# Patient Record
Sex: Male | Born: 1945 | Race: White | Hispanic: No | Marital: Married | Smoking: Former smoker
Health system: Southern US, Community
[De-identification: ages and names within clinical notes are randomized; demographics above are authoritative.]

## PROBLEM LIST (undated history)

## (undated) DIAGNOSIS — E119 Type 2 diabetes mellitus without complications: Secondary | ICD-10-CM

## (undated) DIAGNOSIS — E785 Hyperlipidemia, unspecified: Secondary | ICD-10-CM

## (undated) DIAGNOSIS — I10 Essential (primary) hypertension: Secondary | ICD-10-CM

## (undated) HISTORY — DX: Hyperlipidemia, unspecified: E78.5

## (undated) HISTORY — PX: TOTAL KNEE ARTHROPLASTY: SHX125

## (undated) HISTORY — PX: HERNIA REPAIR: SHX51

## (undated) HISTORY — DX: Type 2 diabetes mellitus without complications: E11.9

## (undated) HISTORY — DX: Essential (primary) hypertension: I10

---

## 2005-12-12 ENCOUNTER — Ambulatory Visit (HOSPITAL_COMMUNITY): Admission: RE | Admit: 2005-12-12 | Discharge: 2005-12-12 | Payer: Self-pay | Admitting: Family Medicine

## 2009-01-04 ENCOUNTER — Encounter (INDEPENDENT_AMBULATORY_CARE_PROVIDER_SITE_OTHER): Payer: Self-pay | Admitting: *Deleted

## 2009-10-20 ENCOUNTER — Telehealth: Payer: Self-pay | Admitting: Gastroenterology

## 2010-07-10 ENCOUNTER — Inpatient Hospital Stay (HOSPITAL_COMMUNITY): Admission: RE | Admit: 2010-07-10 | Discharge: 2010-07-12 | Payer: Self-pay | Admitting: Orthopedic Surgery

## 2010-08-02 ENCOUNTER — Encounter (HOSPITAL_COMMUNITY)
Admission: RE | Admit: 2010-08-02 | Discharge: 2010-09-01 | Payer: Self-pay | Source: Home / Self Care | Admitting: Orthopedic Surgery

## 2010-09-02 ENCOUNTER — Encounter (HOSPITAL_COMMUNITY)
Admission: RE | Admit: 2010-09-02 | Discharge: 2010-10-02 | Payer: Self-pay | Source: Home / Self Care | Attending: Orthopedic Surgery | Admitting: Orthopedic Surgery

## 2010-09-06 ENCOUNTER — Ambulatory Visit (HOSPITAL_COMMUNITY)
Admission: RE | Admit: 2010-09-06 | Discharge: 2010-09-06 | Payer: Self-pay | Source: Home / Self Care | Attending: General Surgery | Admitting: General Surgery

## 2010-10-31 NOTE — Progress Notes (Signed)
Summary: Schedule Colonoscopy  Phone Note Outgoing Call Call back at [facility] Phone [phone]   Call placed by: Harlow Mares CMA Duncan Dull),  October 20, 2009 3:45 PM Call placed to: Patient Summary of Call: number busy. Initial call taken by: Harlow Mares CMA Duncan Dull),  October 20, 2009 3:45 PM  Follow-up for Phone Call        number busy Follow-up by: Harlow Mares CMA Duncan Dull),  October 25, 2009 2:03 PM  Additional Follow-up for Phone Call Additional follow up Details #1::        patient goes to [place] [facility] in [place]. Lady Gary will note in [facility] patient change practices. Additional Follow-up by: Harlow Mares CMA (AAMA),  November 02, 2009 3:35 PM

## 2010-12-12 LAB — SURGICAL PCR SCREEN: MRSA, PCR: NEGATIVE

## 2010-12-12 LAB — CBC
HCT: 38.5 % — ABNORMAL LOW (ref 39.0–52.0)
Hemoglobin: 14.1 g/dL (ref 13.0–17.0)
MCV: 84.8 fL (ref 78.0–100.0)
RBC: 4.54 MIL/uL (ref 4.22–5.81)
WBC: 8.7 10*3/uL (ref 4.0–10.5)

## 2010-12-12 LAB — BASIC METABOLIC PANEL
BUN: 8 mg/dL (ref 6–23)
Chloride: 101 mEq/L (ref 96–112)
GFR calc Af Amer: >60 mL/min (ref >60)
Potassium: 2.8 mEq/L — ABNORMAL LOW (ref 3.5–5.1)
Sodium: 141 mEq/L (ref 135–145)

## 2010-12-12 LAB — POCT I-STAT 4, (NA,K, GLUC, HGB,HCT): Glucose, Bld: 126 mg/dL — ABNORMAL HIGH (ref 70–99)

## 2010-12-14 LAB — BASIC METABOLIC PANEL
BUN: 10 mg/dL (ref 6–23)
BUN: 9 mg/dL (ref 6–23)
CO2: 31 mEq/L (ref 19–32)
Calcium: 7.8 mg/dL — ABNORMAL LOW (ref 8.4–10.5)
Calcium: 7.9 mg/dL — ABNORMAL LOW (ref 8.4–10.5)
Chloride: 103 mEq/L (ref 96–112)
Chloride: 107 mEq/L (ref 96–112)
Creatinine, Ser: 1.1 mg/dL (ref 0.4–1.5)
Creatinine, Ser: 1.22 mg/dL (ref 0.4–1.5)
GFR calc Af Amer: >60 mL/min (ref >60)
Glucose, Bld: 136 mg/dL — ABNORMAL HIGH (ref 70–99)

## 2010-12-14 LAB — APTT: aPTT: 33 s (ref 24–37)

## 2010-12-14 LAB — CBC
HCT: 33.1 % — ABNORMAL LOW (ref 39.0–52.0)
HCT: 40.7 % (ref 39.0–52.0)
Hemoglobin: 11.7 g/dL — ABNORMAL LOW (ref 13.0–17.0)
Hemoglobin: 14.9 g/dL (ref 13.0–17.0)
MCH: 30.8 pg (ref 26.0–34.0)
MCH: 31.5 pg (ref 26.0–34.0)
MCH: 32 pg (ref 26.0–34.0)
MCHC: 35.3 g/dL (ref 30.0–36.0)
MCHC: 36.6 g/dL — ABNORMAL HIGH (ref 30.0–36.0)
MCV: 87.5 fL (ref 78.0–100.0)
MCV: 87.7 fL (ref 78.0–100.0)
MCV: 89 fL (ref 78.0–100.0)
Platelets: 172 10*3/uL (ref 150–400)
Platelets: 199 K/uL (ref 150–400)
RBC: 3.72 MIL/uL — ABNORMAL LOW (ref 4.22–5.81)
RBC: 4.65 MIL/uL (ref 4.22–5.81)
RDW: 12.8 % (ref 11.5–15.5)
RDW: 12.8 % (ref 11.5–15.5)
WBC: 10 K/uL (ref 4.0–10.5)
WBC: 8.4 10*3/uL (ref 4.0–10.5)

## 2010-12-14 LAB — DIFFERENTIAL
Basophils Absolute: 0 10*3/uL (ref 0.0–0.1)
Eosinophils Absolute: 0.1 10*3/uL (ref 0.0–0.7)
Eosinophils Relative: 1 % (ref 0–5)
Lymphocytes Relative: 29 % (ref 12–46)
Lymphs Abs: 2.9 10*3/uL (ref 0.7–4.0)
Neutrophils Relative %: 62 % (ref 43–77)

## 2010-12-14 LAB — CROSSMATCH
ABO/RH(D): O POS
Antibody Screen: NEGATIVE

## 2010-12-14 LAB — COMPREHENSIVE METABOLIC PANEL
ALT: 20 U/L (ref 0–53)
CO2: 34 mEq/L — ABNORMAL HIGH (ref 19–32)
Calcium: 9.5 mg/dL (ref 8.4–10.5)
Chloride: 100 mEq/L (ref 96–112)
Creatinine, Ser: 1.15 mg/dL (ref 0.4–1.5)
GFR calc non Af Amer: >60 mL/min (ref >60)
Glucose, Bld: 112 mg/dL — ABNORMAL HIGH (ref 70–99)
Total Bilirubin: 0.7 mg/dL (ref 0.3–1.2)

## 2010-12-14 LAB — URINALYSIS, ROUTINE W REFLEX MICROSCOPIC
Glucose, UA: 250 mg/dL — AB
Hgb urine dipstick: NEGATIVE
Ketones, ur: NEGATIVE mg/dL
Protein, ur: NEGATIVE mg/dL
Urobilinogen, UA: 1 mg/dL (ref 0.0–1.0)

## 2010-12-14 LAB — URINE CULTURE
Colony Count: NO GROWTH
Culture: NO GROWTH

## 2010-12-14 LAB — PROTIME-INR
INR: 1.09 (ref 0.00–1.49)
Prothrombin Time: 14.3 seconds (ref 11.6–15.2)

## 2010-12-14 LAB — SURGICAL PCR SCREEN
MRSA, PCR: POSITIVE — AB
Staphylococcus aureus: POSITIVE — AB

## 2011-05-11 IMAGING — CR DG CHEST 2V
2 series · 2 of 2 positions shown · non-contrast
Comparison: 12/12/2005

CLINICAL DATA: Preop.

CHEST - 2 VIEW

[view not recorded (1 of 2)]
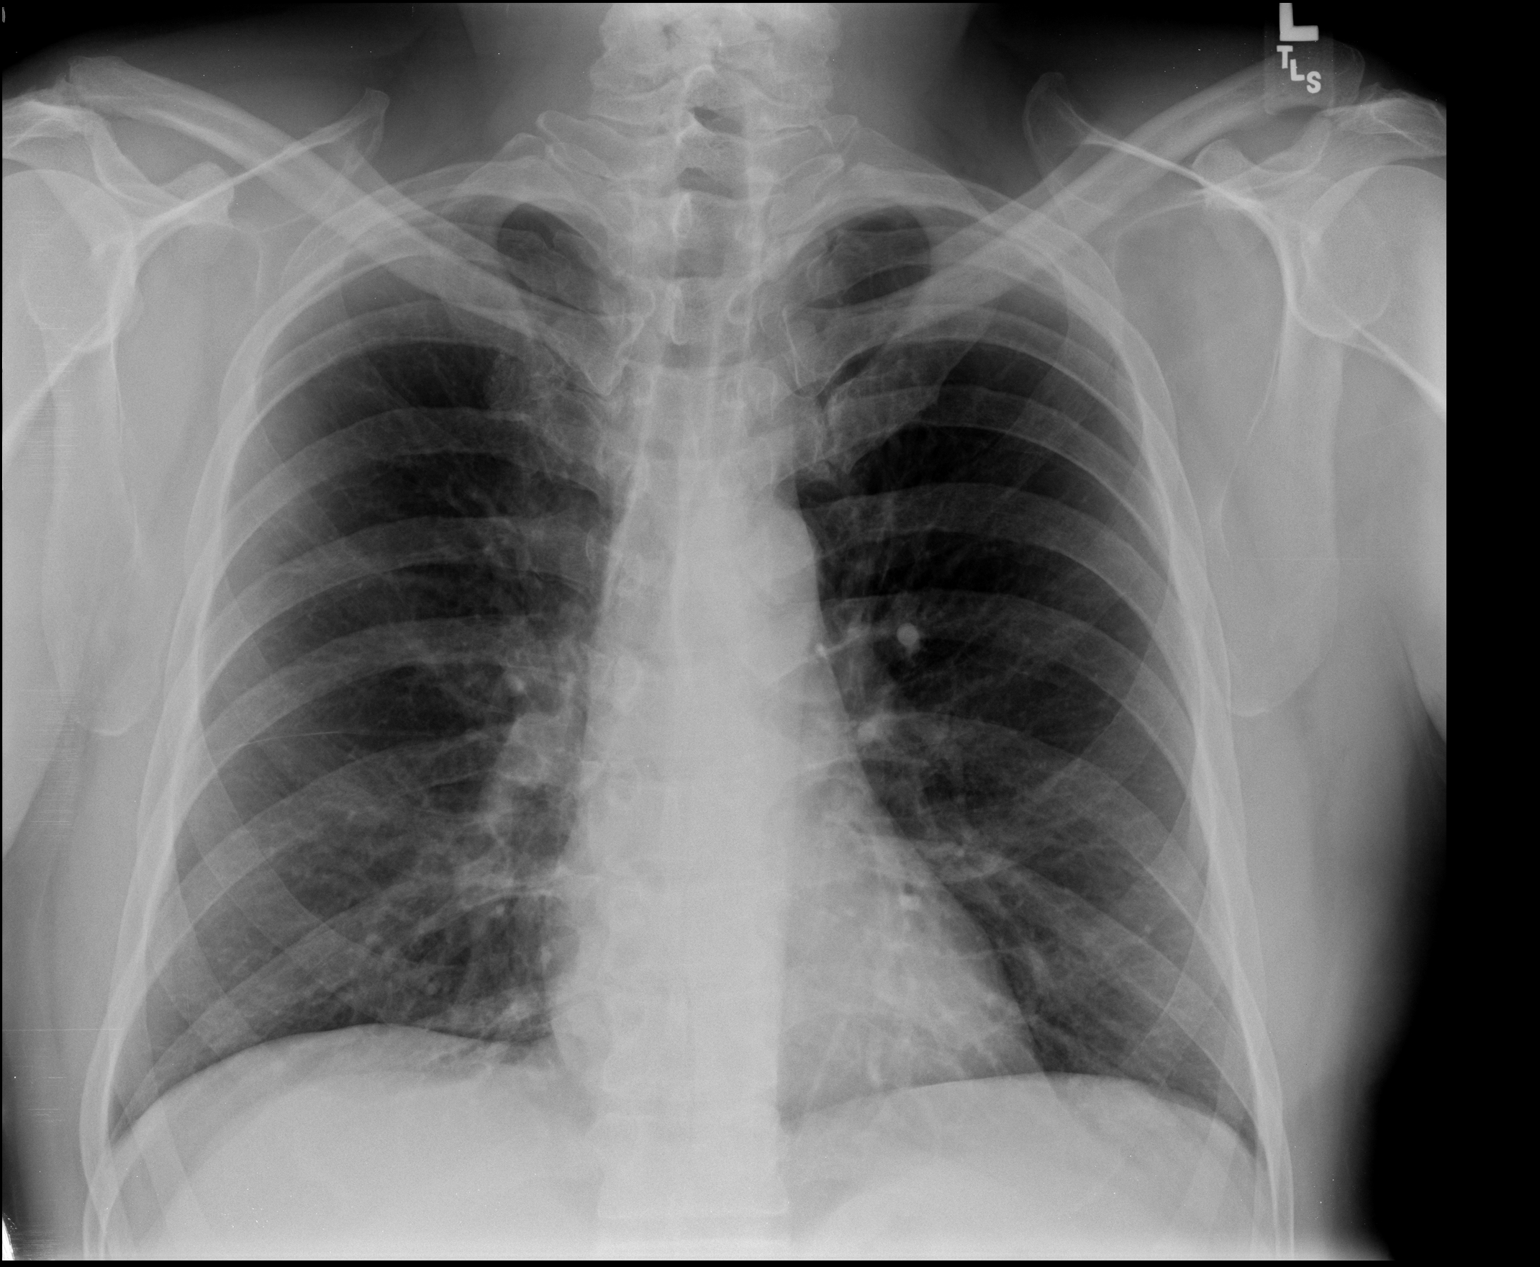

[view not recorded (2 of 2)]
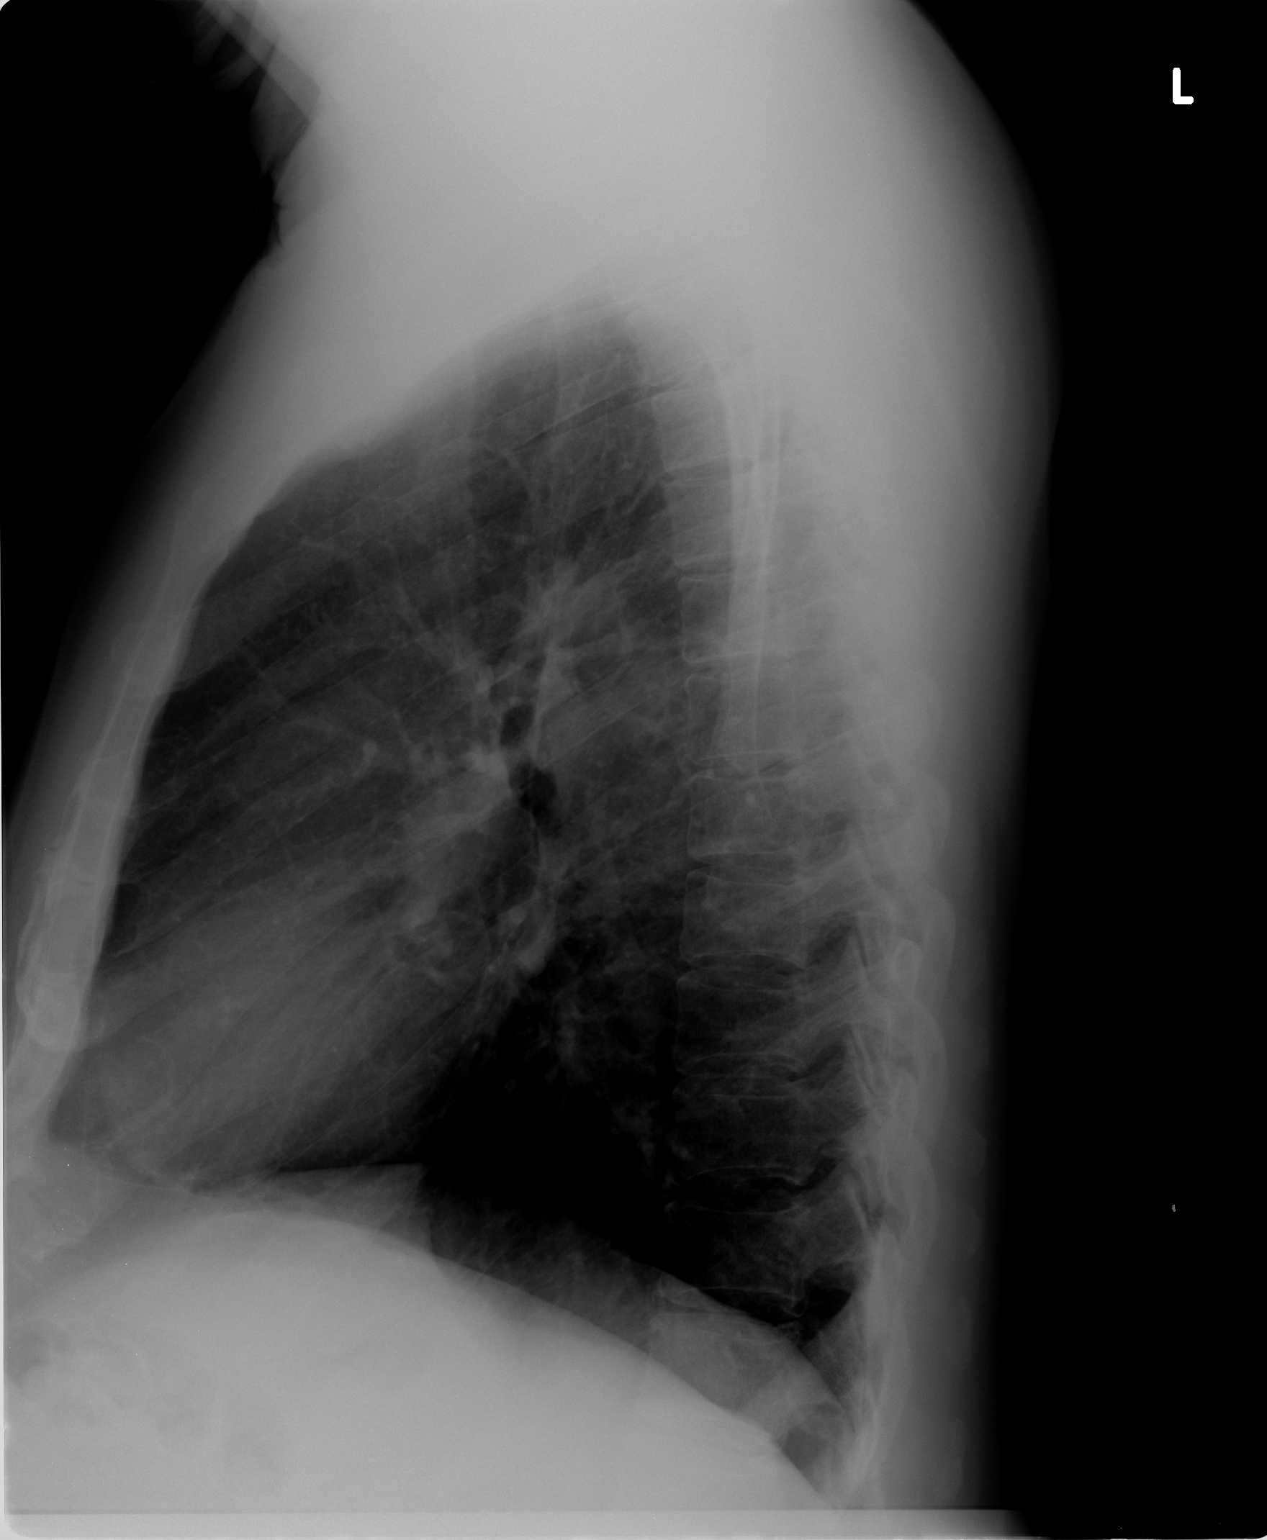

[2 of 2 positions shown; findings below may reference images not displayed]

FINDINGS: Trachea is midline.  Heart size normal.  Lungs are clear.
No pleural fluid.
IMPRESSION: No acute findings.

## 2013-09-07 SURGERY — Surgical Case
Anesthesia: *Unknown

## 2014-10-27 ENCOUNTER — Telehealth: Payer: Self-pay | Admitting: Gastroenterology

## 2014-10-27 ENCOUNTER — Encounter: Payer: Self-pay | Admitting: Gastroenterology

## 2014-12-01 ENCOUNTER — Ambulatory Visit (AMBULATORY_SURGERY_CENTER): Payer: Self-pay

## 2014-12-01 VITALS — Ht 71.0 in | Wt 199.0 lb

## 2014-12-01 DIAGNOSIS — Z860100 Personal history of colon polyps, unspecified: Secondary | ICD-10-CM

## 2014-12-01 MED ORDER — MOVIPREP 100 G PO SOLR: 1.0000 | Freq: Once | ORAL | Status: DC

## 2014-12-01 NOTE — Progress Notes (Signed)
No allergies to eggs or soy No home oxygen No past problems with anesthesia No diet/weight loss meds  Has email  Emmi  Instructions given for colonoscopy

## 2014-12-02 ENCOUNTER — Ambulatory Visit (INDEPENDENT_AMBULATORY_CARE_PROVIDER_SITE_OTHER): Payer: Medicare Other | Admitting: Otolaryngology

## 2014-12-02 DIAGNOSIS — J340 Abscess, furuncle and carbuncle of nose: Secondary | ICD-10-CM | POA: Diagnosis not present

## 2014-12-03 ENCOUNTER — Telehealth: Payer: Self-pay | Admitting: Gastroenterology

## 2014-12-03 NOTE — Telephone Encounter (Signed)
Patient states his prep was 102 dollars at the pharmacy. I asked patient if I can mail him a 10 dollar voucher to help with the cost of the prep. Pt states that would be great and he will wait for the coupon and then go pick up the prep.

## 2014-12-09 ENCOUNTER — Ambulatory Visit (INDEPENDENT_AMBULATORY_CARE_PROVIDER_SITE_OTHER): Payer: Medicare Other | Admitting: Otolaryngology

## 2014-12-09 DIAGNOSIS — J340 Abscess, furuncle and carbuncle of nose: Secondary | ICD-10-CM

## 2014-12-15 ENCOUNTER — Ambulatory Visit (AMBULATORY_SURGERY_CENTER): Payer: Medicare Other | Admitting: Gastroenterology

## 2014-12-15 ENCOUNTER — Encounter: Payer: Self-pay | Admitting: Gastroenterology

## 2014-12-15 VITALS — BP 126/84 | HR 58 | Temp 97.3°F | Resp 20 | Ht 71.0 in | Wt 199.0 lb

## 2014-12-15 DIAGNOSIS — Z860101 Personal history of adenomatous and serrated colon polyps: Secondary | ICD-10-CM

## 2014-12-15 DIAGNOSIS — Z86010 Personal history of colonic polyps: Secondary | ICD-10-CM

## 2014-12-15 MED ORDER — SODIUM CHLORIDE 0.9 % IV SOLN: 500.0000 mL | INTRAVENOUS | Status: DC

## 2014-12-15 NOTE — Op Note (Signed)
[place]  [address] [place], [phone]   COLONOSCOPY PROCEDURE REPORT  PATIENT: [name], Nathaniel  MR#: [id number] BIRTHDATE: [birth year] , 6  yrs. old GENDER: male ENDOSCOPIST: Ladene Artist, MD, [facility] REFERRED RD:EYCX Hilma Favors, M.D. PROCEDURE DATE:  12/15/2014 PROCEDURE:   Colonoscopy, surveillance First Screening Colonoscopy - Avg.  risk and is 50 yrs.  old or older - No.  Prior Negative Screening - Now for repeat screening. N/A  History of Adenoma - Now for follow-up colonoscopy & has been > or = to 3 yrs.  Yes hx of adenoma.  Has been 3 or more years since last colonoscopy. ASA CLASS:   Class II INDICATIONS:Surveillance due to prior colonic neoplasia and PH Colon Adenoma. MEDICATIONS: Monitored anesthesia care and Propofol 200 mg IV DESCRIPTION OF PROCEDURE:   After the risks benefits and alternatives of the procedure were thoroughly explained, informed consent was obtained.  The digital rectal exam revealed no abnormalities of the rectum.   The LB [id number]  endoscope was introduced through the anus and advanced to the cecum, which was identified by both the appendix and ileocecal valve. No adverse events experienced.   The quality of the prep was good.  (MoviPrep was used)  The instrument was then slowly withdrawn as the colon was fully examined.    COLON FINDINGS: A normal appearing cecum, ileocecal valve, and appendiceal orifice were identified.  The ascending, transverse, descending, sigmoid colon, and rectum appeared unremarkable. Retroflexed views revealed internal Grade I hemorrhoids. The time to cecum = 2.2 Withdrawal time = 11.2   The scope was withdrawn and the procedure completed. COMPLICATIONS: There were no immediate complications.  ENDOSCOPIC IMPRESSION: 1.  Normal colonoscopy 2.  Grade I internal hemorrhoids  RECOMMENDATIONS: Repeat Colonoscopy in 5 years.  eSigned:  Ladene Artist, MD, [facility] 12/15/2014 9:07  AM

## 2014-12-15 NOTE — Progress Notes (Signed)
Report to PACU, RN, vss, BBS= Clear.  

## 2014-12-15 NOTE — Patient Instructions (Signed)
Normal colon exam today, hemorrhoids seen today. Handout on hemorrhoids given. Repeat colonoscopy in 5 years. Call us with any questions or concerns. Thank you!  YOU HAD AN ENDOSCOPIC PROCEDURE TODAY AT [facility]:   Refer to the procedure report that was given to you for any specific questions about what was found during the examination.  If the procedure report does not answer your questions, please call your gastroenterologist to clarify.  If you requested that your care partner not be given the details of your procedure findings, then the procedure report has been included in a sealed envelope for you to review at your convenience later.  YOU SHOULD EXPECT: Some feelings of bloating in the abdomen. Passage of more gas than usual.  Walking can help get rid of the air that was put into your GI tract during the procedure and reduce the bloating. If you had a lower endoscopy (such as a colonoscopy or flexible sigmoidoscopy) you may notice spotting of blood in your stool or on the toilet paper. If you underwent a bowel prep for your procedure, you may not have a normal bowel movement for a few days.  Please Note:  You might notice some irritation and congestion in your nose or some drainage.  This is from the oxygen used during your procedure.  There is no need for concern and it should clear up in a day or so.  SYMPTOMS TO REPORT IMMEDIATELY:   Following lower endoscopy (colonoscopy or flexible sigmoidoscopy):  Excessive amounts of blood in the stool  Significant tenderness or worsening of abdominal pains  Swelling of the abdomen that is new, acute  Fever of 100F or higher   Following upper endoscopy (EGD)  Vomiting of blood or coffee ground material  New chest pain or pain under the shoulder blades  Painful or persistently difficult swallowing  New shortness of breath  Fever of 100F or higher  Black, tarry-looking stools  For urgent or emergent issues, a  gastroenterologist can be reached at any hour by calling [phone].   DIET: Your first meal following the procedure should be a small meal and then it is ok to progress to your normal diet. Heavy or fried foods are harder to digest and may make you feel nauseous or bloated.  Likewise, meals heavy in dairy and vegetables can increase bloating.  Drink plenty of fluids but you should avoid alcoholic beverages for 24 hours.  ACTIVITY:  You should plan to take it easy for the rest of today and you should NOT DRIVE or use heavy machinery until tomorrow (because of the sedation medicines used during the test).    FOLLOW UP: Our staff will call the number listed on your records the next business day following your procedure to check on you and address any questions or concerns that you may have regarding the information given to you following your procedure. If we do not reach you, we will leave a message.  However, if you are feeling well and you are not experiencing any problems, there is no need to return our call.  We will assume that you have returned to your regular daily activities without incident.  If any biopsies were taken you will be contacted by phone or by letter within the next 1-3 weeks.  Please call us at [phone] if you have not heard about the biopsies in 3 weeks.    SIGNATURES/CONFIDENTIALITY: You and/or your care partner have signed paperwork which will be  entered into your electronic medical record.  These signatures attest to the fact that that the information above on your After Visit Summary has been reviewed and is understood.  Full responsibility of the confidentiality of this discharge information lies with you and/or your care-partner. 

## 2014-12-16 ENCOUNTER — Telehealth: Payer: Self-pay

## 2014-12-16 NOTE — Telephone Encounter (Signed)
  Follow up Call-  Call back number 12/15/2014  Post procedure Call Back phone  # [phone]  Permission to leave phone message Yes     Patient questions:  Do you have a fever, pain , or abdominal swelling? No. Pain Score  0 *  Have you tolerated food without any problems? Yes.    Have you been able to return to your normal activities? Yes.    Do you have any questions about your discharge instructions: Diet   No. Medications  No. Follow up visit  No.  Do you have questions or concerns about your Care? No.  Actions: * If pain score is 4 or above: No action needed, pain <4.

## 2014-12-17 NOTE — Telephone Encounter (Signed)
appt done

## 2015-12-21 ENCOUNTER — Ambulatory Visit (INDEPENDENT_AMBULATORY_CARE_PROVIDER_SITE_OTHER): Payer: PPO | Admitting: Urology

## 2015-12-21 DIAGNOSIS — N401 Enlarged prostate with lower urinary tract symptoms: Secondary | ICD-10-CM

## 2015-12-21 DIAGNOSIS — R351 Nocturia: Secondary | ICD-10-CM

## 2015-12-21 DIAGNOSIS — N476 Balanoposthitis: Secondary | ICD-10-CM

## 2015-12-21 DIAGNOSIS — R350 Frequency of micturition: Secondary | ICD-10-CM | POA: Diagnosis not present

## 2015-12-23 ENCOUNTER — Other Ambulatory Visit: Payer: Self-pay

## 2015-12-23 DIAGNOSIS — N471 Phimosis: Secondary | ICD-10-CM

## 2015-12-30 ENCOUNTER — Telehealth: Payer: Self-pay | Admitting: Urology

## 2015-12-30 NOTE — Patient Instructions (Addendum)
Nathaniel [name]  12/30/2015     @PREFPERIOPPHARMACY @   Your procedure is scheduled on  01/06/2016   Report to [name] at  11:30  A.M.  Call this number if you have problems the morning of surgery:  [phone]   Remember:  Do not eat food or drink liquids after midnight.  Take these medicines the morning of surgery with A SIP OF WATER  Lisinopril, flomax.   Do not wear jewelry, make-up or nail polish.  Do not wear lotions, powders, or perfumes.  You may wear deodorant.  Do not shave 48 hours prior to surgery.  Men may shave face and neck.  Do not bring valuables to the [name].  [name] is not responsible for any belongings or valuables.  Contacts, dentures or bridgework may not be worn into surgery.  Leave your suitcase in the car.  After surgery it may be brought to your room.  For patients admitted to the [name], discharge time will be determined by your treatment team.  Patients discharged the day of surgery will not be allowed to drive home.   Name and phone number of your driver:   family Special instructions:  none  Please read over the following fact sheets that you were given. Coughing and Deep Breathing, Surgical Site Infection Prevention, Anesthesia Post-op Instructions and Care and Recovery After Surgery      Circumcision, Adult Circumcision is a surgery to remove the foreskin of the penis or to cut the foreskin so the opening is larger. When only the foreskin is cut, it is called a dorsal (on top of the foreskin) incision. This procedure leaves the entire foreskin but makes the end of the foreskin looser so it can be pulled back over the head of the penis.  LET [facility] CARE PROVIDER KNOW ABOUT:  Any allergies you have.  All medicines you are taking, including vitamins, herbs, eye drops, creams, and over the counter medicines.  Previous problems you or members of your family have had with the use of anesthetics.  Any blood  disorders you have.  Previous surgeries you have had.  Medical conditions you have. RISKS AND COMPLICATIONS  Generally, circumcision is a safe procedure. However, as with any procedure, problems can occur. Possible problems include:  Bleeding.  Infection.  Pain.  Urethral injury. The urethra is the opening on the end of the penis that carries your urine out.  Breaking open of the surgical wound can occur from an unwanted erection after surgery. BEFORE THE PROCEDURE   Do not take aspirin or blood thinners for a week prior to surgery, or as your health care provider suggests.  Do not eat or drink anything after midnight the night before surgery, or as instructed.  Let your health care provider know if you develop a cold or other infection before surgery.  You should be present 1 hour before the procedure, or as directed.  Before the procedure, you will be washed and given a local anesthetic to make sure you feel no pain. PROCEDURE  An anesthetic will be given. When an anesthetic that just numbs the area of the procedure (local anesthetic) is used, the anesthetic will be injected with a needle into the skin of your penis to numb the nerves of your foreskin. Your surgeon will remove the excess foreskin with a surgical knife. Absorbable sutures will be used to close the incision after the foreskin has been removed. The  sutures will not need to be removed.  AFTER THE PROCEDURE A sterile dressing will be applied to the incision site. It may be difficult to keep the dressing in place. It is alright if the dressing comes off, especially at night. Air will help a scab to form which will eliminate the need for dressings during the day.   This information is not intended to replace advice given to you by your health care provider. Make sure you discuss any questions you have with your health care provider.   Document Released: 10/07/2007 Document Revised: 10/08/2014 Document Reviewed:  02/17/2013 Elsevier Interactive Patient Education 2016 [employer]. Circumcision, Adult, Care After These instructions give you information on caring for yourself after your procedure. Your doctor may also give you more specific instructions. Call your doctor if you have any problems or questions after your procedure. HOME CARE  Only take medicine as told by your doctor.  Any bandages (dressings) should stay on for at least 24 hours.  You may take the bandages off at night to let air get to the area where your doctor made a cut (incision site). Once a scab forms over the cut, you will not need to use bandages.  Carefully remove the bandage if it gets dirty. Apply medicated cream to the cut. Carefully put a new bandage on if a scab has not formed.  Do not have sex until your doctor says it is okay.  Do not get your cut wet for 24 hours or as told by your doctor.  You may take a sponge bath. Clean around the cut gently with mild soap and water.  You may take a shower after 24 hours or as told by your doctor. Do not take a tub bath. After you shower, gently pat your cut dry. Do not rub it.  Avoid heavy lifting.  Avoid contact sports, biking, or swimming until you have healed. This usually takes 10-14 days. GET HELP IF:  You have pain that does not go away after you take medicine for it.  You have puffiness (swelling) or redness that is unexpected.  You have a fever. GET HELP RIGHT AWAY IF:   You cannot pee (urinate).  You have pain when you pee.  Your pain is not helped by medicines.  There is redness, puffiness, and soreness spreading up the shaft of your penis, your thighs, or your lower belly (abdomen).  There is yellowish-white fluid (pus) coming from your cut.  You have bleeding that does not stop when you press on it. MAKE SURE YOU:  Understand these instructions.  Will watch your condition.  Will get help right away if you are not doing well or get worse.     This information is not intended to replace advice given to you by your health care provider. Make sure you discuss any questions you have with your health care provider.   Document Released: 03/05/2008 Document Revised: 10/08/2014 Document Reviewed: 05/14/2013 Elsevier Interactive Patient Education 2016 [employer] PATIENT INSTRUCTIONS POST-ANESTHESIA  IMMEDIATELY FOLLOWING SURGERY:  Do not drive or operate machinery for the first twenty four hours after surgery.  Do not make any important decisions for twenty four hours after surgery or while taking narcotic pain medications or sedatives.  If you develop intractable nausea and vomiting or a severe headache please notify your doctor immediately.  FOLLOW-UP:  Please make an appointment with your surgeon as instructed. You do not need to follow up with anesthesia unless specifically instructed to do  so.  WOUND CARE INSTRUCTIONS (if applicable):  Keep a dry clean dressing on the anesthesia/puncture wound site if there is drainage.  Once the wound has quit draining you may leave it open to air.  Generally you should leave the bandage intact for twenty four hours unless there is drainage.  If the epidural site drains for more than 36-48 hours please call the anesthesia department.  QUESTIONS?:  Please feel free to call your physician or the [name] operator if you have any questions, and they will be happy to assist you.

## 2015-12-30 NOTE — Telephone Encounter (Signed)
Pt advised of pre op date/time and sx date. Sx: 01/06/16 @ 1:00pm with dr McKenzie--Circumcision. Nathaniel [name]) Pre op: 01/02/16 @ 11:00am. Post op: 01/11/16 @ 10:30am. No authorization is required for CPT: 54161 per AVR with [employer] insurance @ [phone]

## 2016-01-02 ENCOUNTER — Other Ambulatory Visit: Payer: Self-pay

## 2016-01-02 ENCOUNTER — Encounter (HOSPITAL_COMMUNITY): Payer: Self-pay

## 2016-01-02 ENCOUNTER — Encounter (HOSPITAL_COMMUNITY)
Admission: RE | Admit: 2016-01-02 | Discharge: 2016-01-02 | Disposition: A | Discharge status: Home / Self Care | Payer: PPO | Source: Ambulatory Visit | Attending: Urology | Admitting: Urology

## 2016-01-02 DIAGNOSIS — Z01810 Encounter for preprocedural cardiovascular examination: Secondary | ICD-10-CM | POA: Insufficient documentation

## 2016-01-02 DIAGNOSIS — Z01812 Encounter for preprocedural laboratory examination: Secondary | ICD-10-CM | POA: Insufficient documentation

## 2016-01-02 LAB — CBC WITH DIFFERENTIAL/PLATELET
BASOS ABS: 0 10*3/uL (ref 0.0–0.1)
Basophils Relative: 1 %
Eosinophils Absolute: 0.1 10*3/uL (ref 0.0–0.7)
Eosinophils Relative: 2 %
HEMATOCRIT: 40.5 % (ref 39.0–52.0)
HEMOGLOBIN: 14.2 g/dL (ref 13.0–17.0)
LYMPHS ABS: 2.5 10*3/uL (ref 0.7–4.0)
LYMPHS PCT: 35 %
MCH: 31.2 pg (ref 26.0–34.0)
MCHC: 35.1 g/dL (ref 30.0–36.0)
MCV: 89 fL (ref 78.0–100.0)
Monocytes Absolute: 0.5 10*3/uL (ref 0.1–1.0)
Monocytes Relative: 7 %
NEUTROS ABS: 4.1 10*3/uL (ref 1.7–7.7)
NEUTROS PCT: 55 %
PLATELETS: 218 10*3/uL (ref 150–400)
RBC: 4.55 MIL/uL (ref 4.22–5.81)
RDW: 12.8 % (ref 11.5–15.5)
WBC: 7.4 10*3/uL (ref 4.0–10.5)

## 2016-01-02 LAB — BASIC METABOLIC PANEL
ANION GAP: 8 (ref 5–15)
BUN: 11 mg/dL (ref 6–20)
CHLORIDE: 105 mmol/L (ref 101–111)
CO2: 27 mmol/L (ref 22–32)
Calcium: 8.5 mg/dL — ABNORMAL LOW (ref 8.9–10.3)
Creatinine, Ser: 0.95 mg/dL (ref 0.61–1.24)
GFR calc Af Amer: >60 mL/min (ref >60)
GLUCOSE: 209 mg/dL — AB (ref 65–99)
POTASSIUM: 3.7 mmol/L (ref 3.5–5.1)
Sodium: 140 mmol/L (ref 135–145)

## 2016-01-02 LAB — SURGICAL PCR SCREEN
MRSA, PCR: NEGATIVE
STAPHYLOCOCCUS AUREUS: NEGATIVE

## 2016-01-06 ENCOUNTER — Encounter (HOSPITAL_COMMUNITY): Payer: Self-pay | Admitting: *Deleted

## 2016-01-06 ENCOUNTER — Ambulatory Visit (HOSPITAL_COMMUNITY)
Admission: RE | Admit: 2016-01-06 | Discharge: 2016-01-06 | Disposition: A | Discharge status: Home / Self Care | Payer: PPO | Source: Ambulatory Visit | Attending: Urology | Admitting: Urology

## 2016-01-06 ENCOUNTER — Encounter (HOSPITAL_COMMUNITY)
Admission: RE | Disposition: A | Discharge status: Home / Self Care | Payer: Self-pay | Source: Ambulatory Visit | Attending: Urology

## 2016-01-06 ENCOUNTER — Ambulatory Visit (HOSPITAL_COMMUNITY): Payer: PPO | Admitting: Anesthesiology

## 2016-01-06 DIAGNOSIS — I10 Essential (primary) hypertension: Secondary | ICD-10-CM | POA: Diagnosis not present

## 2016-01-06 DIAGNOSIS — Z87891 Personal history of nicotine dependence: Secondary | ICD-10-CM | POA: Diagnosis not present

## 2016-01-06 DIAGNOSIS — N471 Phimosis: Secondary | ICD-10-CM | POA: Diagnosis not present

## 2016-01-06 DIAGNOSIS — N480 Leukoplakia of penis: Secondary | ICD-10-CM | POA: Insufficient documentation

## 2016-01-06 DIAGNOSIS — Z7982 Long term (current) use of aspirin: Secondary | ICD-10-CM | POA: Insufficient documentation

## 2016-01-06 DIAGNOSIS — Z96652 Presence of left artificial knee joint: Secondary | ICD-10-CM | POA: Diagnosis not present

## 2016-01-06 HISTORY — PX: CIRCUMCISION: SHX1350

## 2016-01-06 LAB — GLUCOSE, CAPILLARY: Glucose-Capillary: 100 mg/dL — ABNORMAL HIGH (ref 65–99)

## 2016-01-06 SURGERY — CIRCUMCISION, ADULT
Anesthesia: General | Site: Penis

## 2016-01-06 MED ORDER — MIDAZOLAM HCL 5 MG/5ML IJ SOLN
INTRAMUSCULAR | Status: DC | PRN
  Administered 2016-01-06: 2 mg via INTRAVENOUS

## 2016-01-06 MED ORDER — PROPOFOL 10 MG/ML IV BOLUS
INTRAVENOUS | Status: DC | PRN
  Administered 2016-01-06: 140 mg via INTRAVENOUS

## 2016-01-06 MED ORDER — CEFAZOLIN SODIUM 1-5 GM-% IV SOLN
1.0000 g | INTRAVENOUS | Status: AC
  Administered 2016-01-06: 1 g via INTRAVENOUS

## 2016-01-06 MED ORDER — FENTANYL CITRATE (PF) 100 MCG/2ML IJ SOLN
INTRAMUSCULAR | Status: AC
  Filled 2016-01-06: qty 2

## 2016-01-06 MED ORDER — SUCCINYLCHOLINE CHLORIDE 20 MG/ML IJ SOLN
INTRAMUSCULAR | Status: AC
  Filled 2016-01-06: qty 1

## 2016-01-06 MED ORDER — LACTATED RINGERS IV SOLN
INTRAVENOUS | Status: DC
  Administered 2016-01-06: 1000 mL via INTRAVENOUS

## 2016-01-06 MED ORDER — CEFAZOLIN SODIUM 1-5 GM-% IV SOLN
INTRAVENOUS | Status: AC
  Filled 2016-01-06: qty 50

## 2016-01-06 MED ORDER — BUPIVACAINE HCL (PF) 0.25 % IJ SOLN
INTRAMUSCULAR | Status: AC
  Filled 2016-01-06: qty 30

## 2016-01-06 MED ORDER — LIDOCAINE HCL 1 % IJ SOLN
INTRAMUSCULAR | Status: DC | PRN
  Administered 2016-01-06: 25 mg via INTRADERMAL

## 2016-01-06 MED ORDER — SULFAMETHOXAZOLE-TRIMETHOPRIM 800-160 MG PO TABS: 1.0000 | ORAL_TABLET | Freq: Two times a day (BID) | ORAL | Status: AC

## 2016-01-06 MED ORDER — 0.9 % SODIUM CHLORIDE (POUR BTL) OPTIME
TOPICAL | Status: DC | PRN
  Administered 2016-01-06: 1000 mL

## 2016-01-06 MED ORDER — MIDAZOLAM HCL 2 MG/2ML IJ SOLN
INTRAMUSCULAR | Status: AC
  Filled 2016-01-06: qty 2

## 2016-01-06 MED ORDER — OXYCODONE-ACETAMINOPHEN 5-325 MG PO TABS: 1.0000 | ORAL_TABLET | ORAL | Status: AC | PRN

## 2016-01-06 MED ORDER — ONDANSETRON HCL 4 MG/2ML IJ SOLN
4.0000 mg | Freq: Once | INTRAMUSCULAR | Status: AC
  Administered 2016-01-06: 4 mg via INTRAVENOUS

## 2016-01-06 MED ORDER — FENTANYL CITRATE (PF) 100 MCG/2ML IJ SOLN
INTRAMUSCULAR | Status: DC | PRN
  Administered 2016-01-06: 50 ug via INTRAVENOUS
  Administered 2016-01-06 (×2): 25 ug via INTRAVENOUS

## 2016-01-06 MED ORDER — LIDOCAINE HCL 1 % IJ SOLN
INTRAMUSCULAR | Status: DC | PRN
  Administered 2016-01-06: 10 mL

## 2016-01-06 MED ORDER — ONDANSETRON HCL 4 MG/2ML IJ SOLN
INTRAMUSCULAR | Status: AC
  Filled 2016-01-06: qty 2

## 2016-01-06 MED ORDER — LIDOCAINE HCL (PF) 1 % IJ SOLN
INTRAMUSCULAR | Status: AC
  Filled 2016-01-06: qty 30

## 2016-01-06 MED ORDER — MIDAZOLAM HCL 2 MG/2ML IJ SOLN
1.0000 mg | INTRAMUSCULAR | Status: DC | PRN
  Administered 2016-01-06: 2 mg via INTRAVENOUS

## 2016-01-06 MED ORDER — FENTANYL CITRATE (PF) 100 MCG/2ML IJ SOLN
25.0000 ug | INTRAMUSCULAR | Status: AC
  Administered 2016-01-06 (×2): 25 ug via INTRAVENOUS

## 2016-01-06 MED ORDER — DIAZEPAM 5 MG PO TABS: 5.0000 mg | ORAL_TABLET | Freq: Every day | ORAL | Status: AC

## 2016-01-06 MED ORDER — SEVOFLURANE IN SOLN
RESPIRATORY_TRACT | Status: AC
  Filled 2016-01-06: qty 250

## 2016-01-06 SURGICAL SUPPLY — 28 items
BAG HAMPER (MISCELLANEOUS) ×3 IMPLANT
BANDAGE COBAN STERILE 2 (GAUZE/BANDAGES/DRESSINGS) ×3 IMPLANT
BNDG CONFORM 2 STRL LF (GAUZE/BANDAGES/DRESSINGS) ×3 IMPLANT
COVER LIGHT HANDLE STERIS (MISCELLANEOUS) ×4 IMPLANT
DECANTER SPIKE VIAL GLASS SM (MISCELLANEOUS) ×4 IMPLANT
DRSG XEROFORM 1X8 (GAUZE/BANDAGES/DRESSINGS) ×3 IMPLANT
ELECT NDL TIP 2.8 STRL (NEEDLE) ×1 IMPLANT
ELECT NEEDLE TIP 2.8 STRL (NEEDLE) ×3 IMPLANT
ELECT REM PT RETURN 9FT ADLT (ELECTROSURGICAL) ×3
ELECTRODE REM PT RTRN 9FT ADLT (ELECTROSURGICAL) ×1 IMPLANT
GAUZE SPONGE 4X4 12PLY STRL (GAUZE/BANDAGES/DRESSINGS) ×2 IMPLANT
GAUZE SPONGE 4X4 16PLY XRAY LF (GAUZE/BANDAGES/DRESSINGS) ×3 IMPLANT
GLOVE BIO SURGEON STRL SZ8 (GLOVE) ×3 IMPLANT
GOWN STRL REUS W/ TWL XL LVL3 (GOWN DISPOSABLE) ×1 IMPLANT
GOWN STRL REUS W/TWL XL LVL3 (GOWN DISPOSABLE) ×6
KIT ROOM TURNOVER APOR (KITS) ×6 IMPLANT
NDL HYPO 25X1 1.5 SAFETY (NEEDLE) IMPLANT
NEEDLE HYPO 25X1 1.5 SAFETY (NEEDLE) ×3 IMPLANT
NS IRRIG 500ML POUR BTL (IV SOLUTION) ×2 IMPLANT
PACK MINOR (CUSTOM PROCEDURE TRAY) ×3 IMPLANT
PAD ARMBOARD 7.5X6 YLW CONV (MISCELLANEOUS) ×3 IMPLANT
SET BASIN LINEN APH (SET/KITS/TRAYS/PACK) ×3 IMPLANT
SPONGE GAUZE 4X4 12PLY (GAUZE/BANDAGES/DRESSINGS) ×2 IMPLANT
SUT VIC AB 4-0 PS2 27 (SUTURE) ×6 IMPLANT
SUT VICRYL 4-0 PS2 18IN ABS (SUTURE) ×6 IMPLANT
SYR CONTROL 10ML LL (SYRINGE) ×2 IMPLANT
TOWEL OR 17X26 10 PK STRL BLUE (TOWEL DISPOSABLE) ×1 IMPLANT
WATER STERILE IRR 500ML POUR (IV SOLUTION) IMPLANT

## 2016-01-06 NOTE — Anesthesia Procedure Notes (Signed)
Procedure Name: LMA Insertion Date/Time: 01/06/2016 1:49 PM Performed by: Charmaine Downs Pre-anesthesia Checklist: Patient identified, Emergency Drugs available, Suction available and Patient being monitored Patient Re-evaluated:Patient Re-evaluated prior to inductionOxygen Delivery Method: Circle system utilized Preoxygenation: Pre-oxygenation with 100% oxygen Intubation Type: IV induction Ventilation: Mask ventilation without difficulty LMA: LMA inserted LMA Size: 4.0 Grade View: Grade I Number of attempts: 1 Placement Confirmation: positive ETCO2 and breath sounds checked- equal and bilateral Tube secured with: Tape Dental Injury: Teeth and Oropharynx as per pre-operative assessment

## 2016-01-06 NOTE — Discharge Instructions (Signed)
Circumcision, Adult, Care After Refer to this sheet in the next few weeks. These instructions provide you with information on caring for yourself after your procedure. Your health care provider may also give you more specific instructions. Your treatment has been planned according to current medical practices, but problems sometimes occur. Call your health care provider if you have any problems or questions after your procedure. WHAT TO EXPECT AFTER THE PROCEDURE After your procedure, it is typical to have the following sensations:  Redness at the incision site.  Soreness at the incision site.  Slight swelling around the incision. HOME CARE INSTRUCTIONS  Take all medicines as directed by your health care provider.  Any dressings should stay on for at least 24 hours. You may take the dressing off at night to let air circulate around the incision. Once a scab has formed over the incision, you no longer need to cover your incision with a dressing.  Carefully remove the bandage if it gets dirty. Apply medicated cream to the incision. Carefully put a new bandage on if a scab has not formed.  Do not have sex until your health care provider says it is okay.  Do not get your incision site wet for 24 hours or as told by your health care provider.  You may take a sponge bath. Clean around the incision site gently with mild soap and water.  You may take a shower after 24 hours or as told by your health care provider. Do not take a tub bath. After you shower, gently pat dry the incision site. Do not rub it.  Avoid heavy lifting.  Avoid contact sports, biking, or swimming until you have healed. This will usually be between 10 days and 14 days after the procedure. SEEK MEDICAL CARE IF:  You are experiencing pain that is not relieved by your medicine.  You have swelling or redness that is unexpected.  You develop a fever. SEEK IMMEDIATE MEDICAL CARE IF:  You cannot urinate.  You have pain  when you urinate.  Your pain is not helped by medicines.  There is redness, swelling, and pain (inflammation) spreading up the shaft of your penis, thighs, or lower abdomen.  There is pus coming from your incision.  You have bleeding that does not stop when you press on it.   This information is not intended to replace advice given to you by your health care provider. Make sure you discuss any questions you have with your health care provider.   Document Released: 07/08/2013 Document Revised: 10/08/2014 Document Reviewed: 07/08/2013 Elsevier Interactive Patient Education 2016 Elsevier Inc[employer]  PATIENT INSTRUCTIONS POST-ANESTHESIA  IMMEDIATELY FOLLOWING SURGERY:  Do not drive or operate machinery for the first twenty four hours after surgery.  Do not make any important decisions for twenty four hours after surgery or while taking narcotic pain medications or sedatives.  If you develop intractable nausea and vomiting or a severe headache please notify your doctor immediately.  FOLLOW-UP:  Please make an appointment with your surgeon as instructed. You do not need to follow up with anesthesia unless specifically instructed to do so.  WOUND CARE INSTRUCTIONS (if applicable):  Keep a dry clean dressing on the anesthesia/puncture wound site if there is drainage.  Once the wound has quit draining you may leave it open to air.  Generally you should leave the bandage intact for twenty four hours unless there is drainage.  If the epidural site drains for more than 36-48 hours please call the anesthesia  department.  QUESTIONS?:  Please feel free to call your physician or the hospital operator if you have any questions, and they will be happy to assist you.

## 2016-01-06 NOTE — H&P (Signed)
Urology Admission H&P  Chief Complaint: penile pain  History of Present Illness: Mr Nathaniel [name] is a 70yo here for consideration of circumcision. 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 The phimosis makes it difficult for him to urinate.   Past Medical History  Diagnosis Date  . Hypertension    Past Surgical History  Procedure Laterality Date  . Total knee arthroplasty      left  . Hernia repair      Home Medications:  Prescriptions prior to admission  Medication Sig Dispense Refill Last Dose  . aspirin EC 81 MG tablet Take 81 mg by mouth daily.   Past Week at Unknown time  . atorvastatin (LIPITOR) 10 MG tablet Take 5 mg by mouth daily.    01/05/2016 at Unknown time  . clotrimazole-betamethasone (LOTRISONE) cream Apply 1 application topically 2 (two) times daily. Apply to groin area.  2 01/05/2016 at Unknown time  . diphenhydrAMINE (SOMINEX) 25 MG tablet Take 25 mg by mouth at bedtime as needed for sleep.   01/05/2016 at Unknown time  . finasteride (PROSCAR) 5 MG tablet Take 1 tablet by mouth daily.  11 01/05/2016 at Unknown time  . lisinopril (PRINIVIL,ZESTRIL) 10 MG tablet Take 10 mg by mouth daily.   01/06/2016 at 0730  . OVER THE COUNTER MEDICATION Take 1 tablet by mouth daily. [facility] [employer] glucosamine HCL chondroitin sulfate joint fluid hyaluronic acid uniflex fruitex-B-calcium fructoborate   01/05/2016 at Unknown time  . tamsulosin (FLOMAX) 0.4 MG CAPS capsule Take 0.4 mg by mouth daily after supper.    01/05/2016 at Unknown time   Allergies: No Known Allergies  Family History  Problem Relation Age of Onset  . Colon cancer Maternal Grandfather   . Esophageal cancer Neg Hx   . Rectal cancer Neg Hx   . Stomach cancer Neg Hx    Social History:  reports that he has quit smoking. He has never used smokeless tobacco. He reports that he does not drink alcohol or use illicit drugs.  Review of Systems  Genitourinary: Positive for dysuria.  All other  systems reviewed and are negative.   Physical Exam:  Vital signs in last 24 hours: Temp:  [97.8 F (36.6 C)] 97.8 F (36.6 C) (04/07 1226) Resp:  [18] 18 (04/07 1226) BP: (184)/(93) 184/93 mmHg (04/07 1226) SpO2:  [96 %] 96 % (04/07 1226) Physical Exam  Constitutional: He is oriented to person, place, and time. He appears well-developed and well-nourished.  HENT:  Head: Normocephalic and atraumatic.  Eyes: EOM are normal. Pupils are equal, round, and reactive to light.  Neck: Normal range of motion. No thyromegaly present.  Cardiovascular: Normal rate and regular rhythm.   Respiratory: Effort normal. No respiratory distress.  GI: Soft. He exhibits no distension and no mass. There is no tenderness. There is no rebound and no guarding.  Musculoskeletal: Normal range of motion.  Neurological: He is alert and oriented to person, place, and time.  Skin: Skin is warm and dry.  Psychiatric: He has a normal mood and affect. His behavior is normal. Judgment and thought content normal.    Laboratory Data:  Results for orders placed or performed during the hospital encounter of 01/06/16 (from the past 24 hour(s))  Glucose, capillary     Status: Abnormal   Collection Time: 01/06/16 12:49 PM  Result Value Ref Range   Glucose-Capillary 100 (H) 65 - 99 mg/dL   Recent Results (from the past 240 hour(s))  Surgical  pcr screen     Status: None   Collection Time: 01/02/16 11:05 AM  Result Value Ref Range Status   MRSA, PCR NEGATIVE NEGATIVE Final   Staphylococcus aureus NEGATIVE NEGATIVE Final    Comment:        The Xpert SA Assay (FDA approved for NASAL specimens in patients over 44 years of age), is one component of a comprehensive surveillance program.  Test performance has been validated by [facility] for patients greater than or equal to 29 year old. It is not intended to diagnose infection nor to guide or monitor treatment.    Creatinine:  Recent Labs  01/02/16 1105   CREATININE 0.95   Baseline Creatinine: unknwon  Impression/Assessment:  69yo with phimosis and recurrent balanoposthitis  Plan:  The risks/benefits/alternatives to circumcision was explained to the patient an he understands and wishes to proceed with surgery  Nicolette Bang 01/06/2016, 1:00 PM

## 2016-01-06 NOTE — Brief Op Note (Signed)
01/06/2016  2:26 PM  PATIENT:  Nathaniel [name]  70 y.o. male  PRE-OPERATIVE DIAGNOSIS:  phimosis with recurrent balanoposthitis  POST-OPERATIVE DIAGNOSIS:  phimosis with recurrent balanopothitis  PROCEDURE:  Procedure(s): CIRCUMCISION ADULT (N/A)  SURGEON:  Surgeon(s) and Role:    * Cleon Gustin, MD - Primary  PHYSICIAN ASSISTANT:   ASSISTANTS: none   ANESTHESIA:   local and general  EBL:  Total I/O In: 300 [I.V.:300] Out: 5 [Blood:5]  BLOOD ADMINISTERED:none  DRAINS: none   LOCAL MEDICATIONS USED:  MARCAINE    and LIDOCAINE   SPECIMEN:  Source of Specimen:  foreskin  DISPOSITION OF SPECIMEN:  PATHOLOGY  COUNTS:  YES  TOURNIQUET:  * No tourniquets in log *  DICTATION: .Note written in EPIC  PLAN OF CARE: Discharge to home after PACU  PATIENT DISPOSITION:  PACU - hemodynamically stable.   Delay start of Pharmacological VTE agent (>24hrs) due to surgical blood loss or risk of bleeding: not applicable

## 2016-01-06 NOTE — Transfer of Care (Signed)
Immediate Anesthesia Transfer of Care Note  Patient: Nathaniel [name]  Procedure(s) Performed: Procedure(s): CIRCUMCISION ADULT (N/A)  Patient Location: PACU  Anesthesia Type:General  Level of Consciousness: awake and patient cooperative  Airway & Oxygen Therapy: Patient Spontanous Breathing and Patient connected to nasal cannula oxygen  Post-op Assessment: Report given to RN, Post -op Vital signs reviewed and stable and Patient moving all extremities  Post vital signs: Reviewed and stable  Last Vitals:  Filed Vitals:   01/06/16 1330 01/06/16 1335  BP: 152/85 150/86  Temp:    Resp: 14 15    Complications: No apparent anesthesia complications

## 2016-01-06 NOTE — Anesthesia Postprocedure Evaluation (Signed)
Anesthesia Post Note  Patient: ASHAI BUNCE  Procedure(s) Performed: Procedure(s) (LRB): CIRCUMCISION ADULT (N/A)  Patient location during evaluation: PACU Anesthesia Type: General Level of consciousness: awake and patient cooperative Pain management: pain level controlled Vital Signs Assessment: post-procedure vital signs reviewed and stable Respiratory status: spontaneous breathing, nonlabored ventilation and respiratory function stable Cardiovascular status: blood pressure returned to baseline Postop Assessment: no signs of nausea or vomiting Anesthetic complications: no    Last Vitals:  Filed Vitals:   01/06/16 1330 01/06/16 1335  BP: 152/85 150/86  Temp:    Resp: 14 15    Last Pain: There were no vitals filed for this visit.               Witt Plitt J

## 2016-01-06 NOTE — Anesthesia Preprocedure Evaluation (Signed)
Anesthesia Evaluation  Patient identified by MRN, date of birth, ID band Patient awake    Reviewed: Allergy & Precautions, NPO status , Patient's Chart, lab work & pertinent test results  Airway Mallampati: II  TM Distance: >3 FB     Dental  (+) Edentulous Upper, Edentulous Lower   Pulmonary former smoker,    breath sounds clear to auscultation       Cardiovascular hypertension, Pt. on medications  Rhythm:Regular Rate:Normal     Neuro/Psych    GI/Hepatic negative GI ROS,   Endo/Other    Renal/GU      Musculoskeletal   Abdominal   Peds  Hematology   Anesthesia Other Findings   Reproductive/Obstetrics                             Anesthesia Physical Anesthesia Plan  ASA: II  Anesthesia Plan: General   Post-op Pain Management:    Induction: Intravenous  Airway Management Planned: LMA  Additional Equipment:   Intra-op Plan:   Post-operative Plan: Extubation in OR  Informed Consent: I have reviewed the patients History and Physical, chart, labs and discussed the procedure including the risks, benefits and alternatives for the proposed anesthesia with the patient or authorized representative who has indicated his/her understanding and acceptance.     Plan Discussed with:   Anesthesia Plan Comments:         Anesthesia Quick Evaluation

## 2016-01-08 NOTE — Op Note (Signed)
Preoperative diagnosis: phimosis  Postoperative diagnosis: Same  Procedure: circumcision Penile block  Attending: Nicolette Bang, MD  Anesthesia: General  History of blood loss: Minimal  Antibiotics: ancef  Drains: none  Specimens: foreskin  Findings: dense phimotic ring. No masses/lesions on glans or foreskin.  Indications: Patient is a 70 year old male with a history of phimosis, difficulty urinating, and recurrent balanoprothitis  After discussing treatment options he decided to proceed with circumcision   Procedure in detail: Prior to procedure consent was obtained.  Patient was brought to the operating room and a brief timeout was done to ensure correct patient, correct procedure, correct site.  General anesthesia was administered and patient was placed in supine position.  His genitalia and abdomen was then prepped and draped in usual sterile fashion.  The phimotic ring was incised sharply and the foreskin was then retracted. An circumferential incision was made 1.5cm below the coronal ridge. We then pulled the foreskin over the glans and made a separate circumferential incision at the level of the coronal ridge. We then sharply incised the foreskin and then freed it from the dartos using electrocautery. We then sent the foreskin for pathology. Individual bleeders were then cauterized and good hemostasis was noted. We then reapproximated the penile skin to the subcoronal incision. We used interrupted 3-0 vicryl to close the incision. We then injected 10cc of 0.25% bupivicaine/ 1% lidocaine for a penile block. Once this was complete we applied a gauze bandage and this then concluded the procedure which was well tolerated by the patient.  Complications: None  Condition: Stable, extubated, transferred to PACU.  Plan: Patient is to be discharged home.  He is to followup in 2 weeks for a wound check

## 2016-01-10 ENCOUNTER — Encounter (HOSPITAL_COMMUNITY): Payer: Self-pay | Admitting: Urology

## 2016-01-11 ENCOUNTER — Ambulatory Visit (INDEPENDENT_AMBULATORY_CARE_PROVIDER_SITE_OTHER): Payer: Self-pay | Admitting: Urology

## 2016-01-11 DIAGNOSIS — N476 Balanoposthitis: Secondary | ICD-10-CM

## 2016-01-11 DIAGNOSIS — N401 Enlarged prostate with lower urinary tract symptoms: Secondary | ICD-10-CM | POA: Diagnosis not present

## 2016-04-11 ENCOUNTER — Ambulatory Visit (INDEPENDENT_AMBULATORY_CARE_PROVIDER_SITE_OTHER): Payer: PPO | Admitting: Urology

## 2016-04-11 DIAGNOSIS — R351 Nocturia: Secondary | ICD-10-CM | POA: Diagnosis not present

## 2016-04-11 DIAGNOSIS — N401 Enlarged prostate with lower urinary tract symptoms: Secondary | ICD-10-CM

## 2016-04-11 DIAGNOSIS — N481 Balanitis: Secondary | ICD-10-CM

## 2016-07-03 DIAGNOSIS — R42 Dizziness and giddiness: Secondary | ICD-10-CM | POA: Diagnosis not present

## 2016-07-09 DIAGNOSIS — Z1389 Encounter for screening for other disorder: Secondary | ICD-10-CM | POA: Diagnosis not present

## 2016-07-09 DIAGNOSIS — Z6827 Body mass index (BMI) 27.0-27.9, adult: Secondary | ICD-10-CM | POA: Diagnosis not present

## 2016-07-09 DIAGNOSIS — I10 Essential (primary) hypertension: Secondary | ICD-10-CM | POA: Diagnosis not present

## 2016-08-02 DIAGNOSIS — Z1389 Encounter for screening for other disorder: Secondary | ICD-10-CM | POA: Diagnosis not present

## 2016-08-02 DIAGNOSIS — Z6827 Body mass index (BMI) 27.0-27.9, adult: Secondary | ICD-10-CM | POA: Diagnosis not present

## 2016-08-02 DIAGNOSIS — I10 Essential (primary) hypertension: Secondary | ICD-10-CM | POA: Diagnosis not present

## 2016-08-02 DIAGNOSIS — R7309 Other abnormal glucose: Secondary | ICD-10-CM | POA: Diagnosis not present

## 2016-08-02 DIAGNOSIS — E782 Mixed hyperlipidemia: Secondary | ICD-10-CM | POA: Diagnosis not present

## 2016-08-02 DIAGNOSIS — Z23 Encounter for immunization: Secondary | ICD-10-CM | POA: Diagnosis not present

## 2016-08-02 DIAGNOSIS — Z0000 Encounter for general adult medical examination without abnormal findings: Secondary | ICD-10-CM | POA: Diagnosis not present

## 2016-09-18 DIAGNOSIS — H2513 Age-related nuclear cataract, bilateral: Secondary | ICD-10-CM | POA: Diagnosis not present

## 2016-12-26 ENCOUNTER — Ambulatory Visit (INDEPENDENT_AMBULATORY_CARE_PROVIDER_SITE_OTHER): Payer: PPO | Admitting: Urology

## 2016-12-26 DIAGNOSIS — N401 Enlarged prostate with lower urinary tract symptoms: Secondary | ICD-10-CM

## 2016-12-26 DIAGNOSIS — N481 Balanitis: Secondary | ICD-10-CM | POA: Diagnosis not present

## 2016-12-26 DIAGNOSIS — R351 Nocturia: Secondary | ICD-10-CM

## 2016-12-31 DIAGNOSIS — Z1389 Encounter for screening for other disorder: Secondary | ICD-10-CM | POA: Diagnosis not present

## 2016-12-31 DIAGNOSIS — I10 Essential (primary) hypertension: Secondary | ICD-10-CM | POA: Diagnosis not present

## 2016-12-31 DIAGNOSIS — J329 Chronic sinusitis, unspecified: Secondary | ICD-10-CM | POA: Diagnosis not present

## 2016-12-31 DIAGNOSIS — Z6827 Body mass index (BMI) 27.0-27.9, adult: Secondary | ICD-10-CM | POA: Diagnosis not present

## 2016-12-31 DIAGNOSIS — N400 Enlarged prostate without lower urinary tract symptoms: Secondary | ICD-10-CM | POA: Diagnosis not present

## 2016-12-31 DIAGNOSIS — E663 Overweight: Secondary | ICD-10-CM | POA: Diagnosis not present

## 2016-12-31 DIAGNOSIS — M1991 Primary osteoarthritis, unspecified site: Secondary | ICD-10-CM | POA: Diagnosis not present

## 2017-01-07 DIAGNOSIS — E663 Overweight: Secondary | ICD-10-CM | POA: Diagnosis not present

## 2017-01-07 DIAGNOSIS — J069 Acute upper respiratory infection, unspecified: Secondary | ICD-10-CM | POA: Diagnosis not present

## 2017-01-07 DIAGNOSIS — Z6827 Body mass index (BMI) 27.0-27.9, adult: Secondary | ICD-10-CM | POA: Diagnosis not present

## 2017-06-28 ENCOUNTER — Other Ambulatory Visit (HOSPITAL_COMMUNITY): Payer: Self-pay | Admitting: Family Medicine

## 2017-06-28 ENCOUNTER — Ambulatory Visit (HOSPITAL_COMMUNITY)
Admission: RE | Admit: 2017-06-28 | Discharge: 2017-06-28 | Disposition: A | Discharge status: Home / Self Care | Payer: PPO | Source: Ambulatory Visit | Attending: Family Medicine | Admitting: Family Medicine

## 2017-06-28 DIAGNOSIS — M75101 Unspecified rotator cuff tear or rupture of right shoulder, not specified as traumatic: Secondary | ICD-10-CM

## 2017-06-28 DIAGNOSIS — N400 Enlarged prostate without lower urinary tract symptoms: Secondary | ICD-10-CM | POA: Diagnosis not present

## 2017-06-28 DIAGNOSIS — E782 Mixed hyperlipidemia: Secondary | ICD-10-CM | POA: Diagnosis not present

## 2017-06-28 DIAGNOSIS — Z6828 Body mass index (BMI) 28.0-28.9, adult: Secondary | ICD-10-CM | POA: Diagnosis not present

## 2017-06-28 DIAGNOSIS — E663 Overweight: Secondary | ICD-10-CM | POA: Diagnosis not present

## 2017-06-28 DIAGNOSIS — M1991 Primary osteoarthritis, unspecified site: Secondary | ICD-10-CM | POA: Diagnosis not present

## 2017-06-28 DIAGNOSIS — Z23 Encounter for immunization: Secondary | ICD-10-CM | POA: Diagnosis not present

## 2017-06-28 DIAGNOSIS — X58XXXA Exposure to other specified factors, initial encounter: Secondary | ICD-10-CM | POA: Diagnosis not present

## 2017-06-28 DIAGNOSIS — M19041 Primary osteoarthritis, right hand: Secondary | ICD-10-CM | POA: Diagnosis not present

## 2017-06-28 DIAGNOSIS — I10 Essential (primary) hypertension: Secondary | ICD-10-CM | POA: Diagnosis not present

## 2017-07-10 DIAGNOSIS — L03115 Cellulitis of right lower limb: Secondary | ICD-10-CM | POA: Diagnosis not present

## 2017-07-10 DIAGNOSIS — E663 Overweight: Secondary | ICD-10-CM | POA: Diagnosis not present

## 2017-07-10 DIAGNOSIS — Z6827 Body mass index (BMI) 27.0-27.9, adult: Secondary | ICD-10-CM | POA: Diagnosis not present

## 2017-07-10 DIAGNOSIS — M7051 Other bursitis of knee, right knee: Secondary | ICD-10-CM | POA: Diagnosis not present

## 2017-07-12 DIAGNOSIS — M7041 Prepatellar bursitis, right knee: Secondary | ICD-10-CM | POA: Diagnosis not present

## 2017-07-25 DIAGNOSIS — Z1389 Encounter for screening for other disorder: Secondary | ICD-10-CM | POA: Diagnosis not present

## 2017-07-25 DIAGNOSIS — Z0000 Encounter for general adult medical examination without abnormal findings: Secondary | ICD-10-CM | POA: Diagnosis not present

## 2017-07-25 DIAGNOSIS — N400 Enlarged prostate without lower urinary tract symptoms: Secondary | ICD-10-CM | POA: Diagnosis not present

## 2017-07-25 DIAGNOSIS — Z23 Encounter for immunization: Secondary | ICD-10-CM | POA: Diagnosis not present

## 2017-07-25 DIAGNOSIS — M75101 Unspecified rotator cuff tear or rupture of right shoulder, not specified as traumatic: Secondary | ICD-10-CM | POA: Diagnosis not present

## 2017-07-26 DIAGNOSIS — E782 Mixed hyperlipidemia: Secondary | ICD-10-CM | POA: Diagnosis not present

## 2017-07-26 DIAGNOSIS — R739 Hyperglycemia, unspecified: Secondary | ICD-10-CM | POA: Diagnosis not present

## 2017-08-06 DIAGNOSIS — Z0000 Encounter for general adult medical examination without abnormal findings: Secondary | ICD-10-CM | POA: Diagnosis not present

## 2017-08-06 DIAGNOSIS — E119 Type 2 diabetes mellitus without complications: Secondary | ICD-10-CM | POA: Diagnosis not present

## 2017-08-06 DIAGNOSIS — R7309 Other abnormal glucose: Secondary | ICD-10-CM | POA: Diagnosis not present

## 2017-08-06 DIAGNOSIS — Z1389 Encounter for screening for other disorder: Secondary | ICD-10-CM | POA: Diagnosis not present

## 2017-08-06 DIAGNOSIS — E663 Overweight: Secondary | ICD-10-CM | POA: Diagnosis not present

## 2017-08-06 DIAGNOSIS — Z6827 Body mass index (BMI) 27.0-27.9, adult: Secondary | ICD-10-CM | POA: Diagnosis not present

## 2017-08-06 DIAGNOSIS — I10 Essential (primary) hypertension: Secondary | ICD-10-CM | POA: Diagnosis not present

## 2017-09-17 DIAGNOSIS — H2513 Age-related nuclear cataract, bilateral: Secondary | ICD-10-CM | POA: Diagnosis not present

## 2017-11-01 DIAGNOSIS — R1310 Dysphagia, unspecified: Secondary | ICD-10-CM | POA: Diagnosis not present

## 2017-11-01 DIAGNOSIS — E663 Overweight: Secondary | ICD-10-CM | POA: Diagnosis not present

## 2017-11-01 DIAGNOSIS — Z6827 Body mass index (BMI) 27.0-27.9, adult: Secondary | ICD-10-CM | POA: Diagnosis not present

## 2017-11-01 DIAGNOSIS — K219 Gastro-esophageal reflux disease without esophagitis: Secondary | ICD-10-CM | POA: Diagnosis not present

## 2017-11-09 DIAGNOSIS — S80812A Abrasion, left lower leg, initial encounter: Secondary | ICD-10-CM | POA: Diagnosis not present

## 2017-11-12 DIAGNOSIS — Z23 Encounter for immunization: Secondary | ICD-10-CM | POA: Diagnosis not present

## 2017-11-21 DIAGNOSIS — Z23 Encounter for immunization: Secondary | ICD-10-CM | POA: Diagnosis not present

## 2017-11-27 ENCOUNTER — Ambulatory Visit: Payer: PPO | Admitting: Gastroenterology

## 2017-11-29 DIAGNOSIS — S81802A Unspecified open wound, left lower leg, initial encounter: Secondary | ICD-10-CM | POA: Diagnosis not present

## 2017-12-06 DIAGNOSIS — S81802A Unspecified open wound, left lower leg, initial encounter: Secondary | ICD-10-CM | POA: Diagnosis not present

## 2017-12-27 DIAGNOSIS — Z6827 Body mass index (BMI) 27.0-27.9, adult: Secondary | ICD-10-CM | POA: Diagnosis not present

## 2017-12-27 DIAGNOSIS — M1991 Primary osteoarthritis, unspecified site: Secondary | ICD-10-CM | POA: Diagnosis not present

## 2017-12-27 DIAGNOSIS — N401 Enlarged prostate with lower urinary tract symptoms: Secondary | ICD-10-CM | POA: Diagnosis not present

## 2017-12-27 DIAGNOSIS — J329 Chronic sinusitis, unspecified: Secondary | ICD-10-CM | POA: Diagnosis not present

## 2018-01-09 DIAGNOSIS — Z6826 Body mass index (BMI) 26.0-26.9, adult: Secondary | ICD-10-CM | POA: Diagnosis not present

## 2018-01-09 DIAGNOSIS — I10 Essential (primary) hypertension: Secondary | ICD-10-CM | POA: Diagnosis not present

## 2018-01-09 DIAGNOSIS — E782 Mixed hyperlipidemia: Secondary | ICD-10-CM | POA: Diagnosis not present

## 2018-01-09 DIAGNOSIS — N400 Enlarged prostate without lower urinary tract symptoms: Secondary | ICD-10-CM | POA: Diagnosis not present

## 2018-01-09 DIAGNOSIS — Z1389 Encounter for screening for other disorder: Secondary | ICD-10-CM | POA: Diagnosis not present

## 2018-01-09 DIAGNOSIS — M1991 Primary osteoarthritis, unspecified site: Secondary | ICD-10-CM | POA: Diagnosis not present

## 2018-01-09 DIAGNOSIS — E663 Overweight: Secondary | ICD-10-CM | POA: Diagnosis not present

## 2018-01-09 DIAGNOSIS — R7309 Other abnormal glucose: Secondary | ICD-10-CM | POA: Diagnosis not present

## 2018-01-09 DIAGNOSIS — M1990 Unspecified osteoarthritis, unspecified site: Secondary | ICD-10-CM | POA: Diagnosis not present

## 2018-03-19 ENCOUNTER — Ambulatory Visit: Payer: PPO | Admitting: Urology

## 2018-03-19 DIAGNOSIS — N401 Enlarged prostate with lower urinary tract symptoms: Secondary | ICD-10-CM

## 2018-03-19 DIAGNOSIS — R351 Nocturia: Secondary | ICD-10-CM

## 2018-04-30 ENCOUNTER — Ambulatory Visit (INDEPENDENT_AMBULATORY_CARE_PROVIDER_SITE_OTHER): Payer: PPO | Admitting: Urology

## 2018-04-30 DIAGNOSIS — R351 Nocturia: Secondary | ICD-10-CM | POA: Diagnosis not present

## 2018-04-30 DIAGNOSIS — N401 Enlarged prostate with lower urinary tract symptoms: Secondary | ICD-10-CM | POA: Diagnosis not present

## 2018-08-06 ENCOUNTER — Ambulatory Visit: Payer: PPO | Admitting: Urology

## 2018-08-21 DIAGNOSIS — I10 Essential (primary) hypertension: Secondary | ICD-10-CM | POA: Diagnosis not present

## 2018-08-21 DIAGNOSIS — N400 Enlarged prostate without lower urinary tract symptoms: Secondary | ICD-10-CM | POA: Diagnosis not present

## 2018-08-21 DIAGNOSIS — Z0001 Encounter for general adult medical examination with abnormal findings: Secondary | ICD-10-CM | POA: Diagnosis not present

## 2018-08-21 DIAGNOSIS — Z23 Encounter for immunization: Secondary | ICD-10-CM | POA: Diagnosis not present

## 2018-08-21 DIAGNOSIS — E663 Overweight: Secondary | ICD-10-CM | POA: Diagnosis not present

## 2018-08-21 DIAGNOSIS — Z1389 Encounter for screening for other disorder: Secondary | ICD-10-CM | POA: Diagnosis not present

## 2018-08-21 DIAGNOSIS — Z6827 Body mass index (BMI) 27.0-27.9, adult: Secondary | ICD-10-CM | POA: Diagnosis not present

## 2018-08-21 DIAGNOSIS — E782 Mixed hyperlipidemia: Secondary | ICD-10-CM | POA: Diagnosis not present

## 2018-08-21 DIAGNOSIS — R7309 Other abnormal glucose: Secondary | ICD-10-CM | POA: Diagnosis not present

## 2018-09-25 DIAGNOSIS — E663 Overweight: Secondary | ICD-10-CM | POA: Diagnosis not present

## 2018-09-25 DIAGNOSIS — J069 Acute upper respiratory infection, unspecified: Secondary | ICD-10-CM | POA: Diagnosis not present

## 2018-09-25 DIAGNOSIS — Z6826 Body mass index (BMI) 26.0-26.9, adult: Secondary | ICD-10-CM | POA: Diagnosis not present

## 2018-10-07 DIAGNOSIS — H2513 Age-related nuclear cataract, bilateral: Secondary | ICD-10-CM | POA: Diagnosis not present

## 2019-01-27 DIAGNOSIS — I10 Essential (primary) hypertension: Secondary | ICD-10-CM | POA: Diagnosis not present

## 2019-01-27 DIAGNOSIS — E663 Overweight: Secondary | ICD-10-CM | POA: Diagnosis not present

## 2019-01-27 DIAGNOSIS — Z6827 Body mass index (BMI) 27.0-27.9, adult: Secondary | ICD-10-CM | POA: Diagnosis not present

## 2019-01-27 DIAGNOSIS — M62830 Muscle spasm of back: Secondary | ICD-10-CM | POA: Diagnosis not present

## 2019-01-27 DIAGNOSIS — M47816 Spondylosis without myelopathy or radiculopathy, lumbar region: Secondary | ICD-10-CM | POA: Diagnosis not present

## 2019-01-27 DIAGNOSIS — M545 Low back pain: Secondary | ICD-10-CM | POA: Diagnosis not present

## 2019-01-28 ENCOUNTER — Other Ambulatory Visit: Payer: Self-pay | Admitting: Internal Medicine

## 2019-01-28 DIAGNOSIS — R109 Unspecified abdominal pain: Secondary | ICD-10-CM

## 2019-03-26 DIAGNOSIS — L570 Actinic keratosis: Secondary | ICD-10-CM | POA: Diagnosis not present

## 2019-03-26 DIAGNOSIS — X32XXXD Exposure to sunlight, subsequent encounter: Secondary | ICD-10-CM | POA: Diagnosis not present

## 2019-03-26 DIAGNOSIS — Z1283 Encounter for screening for malignant neoplasm of skin: Secondary | ICD-10-CM | POA: Diagnosis not present

## 2019-03-26 DIAGNOSIS — D225 Melanocytic nevi of trunk: Secondary | ICD-10-CM | POA: Diagnosis not present

## 2019-07-13 DIAGNOSIS — Z6827 Body mass index (BMI) 27.0-27.9, adult: Secondary | ICD-10-CM | POA: Diagnosis not present

## 2019-07-13 DIAGNOSIS — E663 Overweight: Secondary | ICD-10-CM | POA: Diagnosis not present

## 2019-07-13 DIAGNOSIS — N400 Enlarged prostate without lower urinary tract symptoms: Secondary | ICD-10-CM | POA: Diagnosis not present

## 2019-07-13 DIAGNOSIS — J301 Allergic rhinitis due to pollen: Secondary | ICD-10-CM | POA: Diagnosis not present

## 2019-07-20 DIAGNOSIS — E663 Overweight: Secondary | ICD-10-CM | POA: Diagnosis not present

## 2019-07-20 DIAGNOSIS — Z6827 Body mass index (BMI) 27.0-27.9, adult: Secondary | ICD-10-CM | POA: Diagnosis not present

## 2019-07-20 DIAGNOSIS — J069 Acute upper respiratory infection, unspecified: Secondary | ICD-10-CM | POA: Diagnosis not present

## 2019-07-23 DIAGNOSIS — E663 Overweight: Secondary | ICD-10-CM | POA: Diagnosis not present

## 2019-07-23 DIAGNOSIS — E7849 Other hyperlipidemia: Secondary | ICD-10-CM | POA: Diagnosis not present

## 2019-07-23 DIAGNOSIS — J069 Acute upper respiratory infection, unspecified: Secondary | ICD-10-CM | POA: Diagnosis not present

## 2019-07-23 DIAGNOSIS — R7309 Other abnormal glucose: Secondary | ICD-10-CM | POA: Diagnosis not present

## 2019-07-23 DIAGNOSIS — Z6827 Body mass index (BMI) 27.0-27.9, adult: Secondary | ICD-10-CM | POA: Diagnosis not present

## 2019-07-28 DIAGNOSIS — E1165 Type 2 diabetes mellitus with hyperglycemia: Secondary | ICD-10-CM | POA: Diagnosis not present

## 2019-07-28 DIAGNOSIS — Z1389 Encounter for screening for other disorder: Secondary | ICD-10-CM | POA: Diagnosis not present

## 2019-07-28 DIAGNOSIS — Z6827 Body mass index (BMI) 27.0-27.9, adult: Secondary | ICD-10-CM | POA: Diagnosis not present

## 2019-07-28 DIAGNOSIS — E663 Overweight: Secondary | ICD-10-CM | POA: Diagnosis not present

## 2019-07-28 DIAGNOSIS — Z0000 Encounter for general adult medical examination without abnormal findings: Secondary | ICD-10-CM | POA: Diagnosis not present

## 2019-10-15 DIAGNOSIS — H2513 Age-related nuclear cataract, bilateral: Secondary | ICD-10-CM | POA: Diagnosis not present

## 2019-10-15 DIAGNOSIS — H40051 Ocular hypertension, right eye: Secondary | ICD-10-CM | POA: Diagnosis not present

## 2019-11-02 ENCOUNTER — Encounter: Payer: Self-pay | Admitting: Gastroenterology

## 2019-11-02 DIAGNOSIS — E663 Overweight: Secondary | ICD-10-CM | POA: Diagnosis not present

## 2019-11-02 DIAGNOSIS — E1165 Type 2 diabetes mellitus with hyperglycemia: Secondary | ICD-10-CM | POA: Diagnosis not present

## 2019-11-02 DIAGNOSIS — I10 Essential (primary) hypertension: Secondary | ICD-10-CM | POA: Diagnosis not present

## 2019-11-02 DIAGNOSIS — Z1389 Encounter for screening for other disorder: Secondary | ICD-10-CM | POA: Diagnosis not present

## 2019-11-02 DIAGNOSIS — Z6826 Body mass index (BMI) 26.0-26.9, adult: Secondary | ICD-10-CM | POA: Diagnosis not present

## 2019-12-30 DIAGNOSIS — N400 Enlarged prostate without lower urinary tract symptoms: Secondary | ICD-10-CM | POA: Diagnosis not present

## 2019-12-30 DIAGNOSIS — E1165 Type 2 diabetes mellitus with hyperglycemia: Secondary | ICD-10-CM | POA: Diagnosis not present

## 2019-12-30 DIAGNOSIS — I10 Essential (primary) hypertension: Secondary | ICD-10-CM | POA: Diagnosis not present

## 2019-12-30 DIAGNOSIS — M1991 Primary osteoarthritis, unspecified site: Secondary | ICD-10-CM | POA: Diagnosis not present

## 2020-02-15 ENCOUNTER — Encounter: Payer: Self-pay | Admitting: Gastroenterology

## 2020-02-15 DIAGNOSIS — Z6827 Body mass index (BMI) 27.0-27.9, adult: Secondary | ICD-10-CM | POA: Diagnosis not present

## 2020-02-15 DIAGNOSIS — M1991 Primary osteoarthritis, unspecified site: Secondary | ICD-10-CM | POA: Diagnosis not present

## 2020-02-15 DIAGNOSIS — Z1389 Encounter for screening for other disorder: Secondary | ICD-10-CM | POA: Diagnosis not present

## 2020-02-15 DIAGNOSIS — N400 Enlarged prostate without lower urinary tract symptoms: Secondary | ICD-10-CM | POA: Diagnosis not present

## 2020-02-15 DIAGNOSIS — I10 Essential (primary) hypertension: Secondary | ICD-10-CM | POA: Diagnosis not present

## 2020-02-15 DIAGNOSIS — E1165 Type 2 diabetes mellitus with hyperglycemia: Secondary | ICD-10-CM | POA: Diagnosis not present

## 2020-02-15 DIAGNOSIS — E7849 Other hyperlipidemia: Secondary | ICD-10-CM | POA: Diagnosis not present

## 2020-02-15 DIAGNOSIS — Z0000 Encounter for general adult medical examination without abnormal findings: Secondary | ICD-10-CM | POA: Diagnosis not present

## 2020-02-29 DIAGNOSIS — I10 Essential (primary) hypertension: Secondary | ICD-10-CM | POA: Diagnosis not present

## 2020-02-29 DIAGNOSIS — E1165 Type 2 diabetes mellitus with hyperglycemia: Secondary | ICD-10-CM | POA: Diagnosis not present

## 2020-02-29 DIAGNOSIS — N400 Enlarged prostate without lower urinary tract symptoms: Secondary | ICD-10-CM | POA: Diagnosis not present

## 2020-02-29 DIAGNOSIS — M1991 Primary osteoarthritis, unspecified site: Secondary | ICD-10-CM | POA: Diagnosis not present

## 2020-03-24 ENCOUNTER — Other Ambulatory Visit: Payer: Self-pay

## 2020-03-24 ENCOUNTER — Ambulatory Visit (AMBULATORY_SURGERY_CENTER): Payer: Self-pay | Admitting: *Deleted

## 2020-03-24 VITALS — Ht 71.0 in | Wt 199.0 lb

## 2020-03-24 DIAGNOSIS — Z800 Family history of malignant neoplasm of digestive organs: Secondary | ICD-10-CM

## 2020-03-24 DIAGNOSIS — Z86010 Personal history of colonic polyps: Secondary | ICD-10-CM

## 2020-03-24 MED ORDER — SUTAB 1479-225-188 MG PO TABS: 1.0000 | ORAL_TABLET | Freq: Once | ORAL | 0 refills | Status: AC

## 2020-03-24 NOTE — Progress Notes (Signed)

## 2020-03-25 ENCOUNTER — Encounter: Payer: Self-pay | Admitting: Gastroenterology

## 2020-04-11 ENCOUNTER — Ambulatory Visit (AMBULATORY_SURGERY_CENTER): Payer: PPO | Admitting: Gastroenterology

## 2020-04-11 ENCOUNTER — Other Ambulatory Visit: Payer: Self-pay

## 2020-04-11 ENCOUNTER — Encounter: Payer: Self-pay | Admitting: Gastroenterology

## 2020-04-11 VITALS — BP 124/73 | HR 56 | Temp 97.1°F | Resp 19 | Ht 71.0 in | Wt 199.0 lb

## 2020-04-11 DIAGNOSIS — I10 Essential (primary) hypertension: Secondary | ICD-10-CM | POA: Diagnosis not present

## 2020-04-11 DIAGNOSIS — E119 Type 2 diabetes mellitus without complications: Secondary | ICD-10-CM | POA: Diagnosis not present

## 2020-04-11 DIAGNOSIS — Z86010 Personal history of colonic polyps: Secondary | ICD-10-CM | POA: Diagnosis not present

## 2020-04-11 MED ORDER — SODIUM CHLORIDE 0.9 % IV SOLN: 500.0000 mL | Freq: Once | INTRAVENOUS | Status: DC

## 2020-04-11 NOTE — Op Note (Signed)
[facility] Patient Name: Nathaniel [name] Procedure Date: 04/11/2020 8:26 AM MRN: [id number] Endoscopist: Ladene Artist , MD Age: 74 Referring MD:  Date of Birth: 10/09/1945 Gender: Male Account #: [id number] Procedure:                Colonoscopy Indications:              Surveillance: Personal history of adenomatous                            polyps on last colonoscopy 5 years ago Medicines:                Monitored Anesthesia Care Procedure:                Pre-Anesthesia Assessment:                           - Prior to the procedure, a History and Physical                            was performed, and patient medications and                            allergies were reviewed. The patient's tolerance of                            previous anesthesia was also reviewed. The risks                            and benefits of the procedure and the sedation                            options and risks were discussed with the patient.                            All questions were answered, and informed consent                            was obtained. Prior Anticoagulants: The patient has                            taken no previous anticoagulant or antiplatelet                            agents. ASA Grade Assessment: II - A patient with                            mild systemic disease. After reviewing the risks                            and benefits, the patient was deemed in                            satisfactory condition to undergo the procedure.  After obtaining informed consent, the colonoscope                            was passed under direct vision. Throughout the                            procedure, the patient's blood pressure, pulse, and                            oxygen saturations were monitored continuously. The                            [id number] was introduced through the anus and                            advanced to the the cecum,  identified by                            appendiceal orifice and ileocecal valve. The                            ileocecal valve, appendiceal orifice, and rectum                            were photographed. The quality of the bowel                            preparation was good. The colonoscopy was performed                            without difficulty. The patient tolerated the                            procedure well. Scope In: 8:30:10 AM Scope Out: 8:42:23 AM Scope Withdrawal Time: 0 hours 9 minutes 52 seconds  Total Procedure Duration: 0 hours 12 minutes 13 seconds  Findings:                 The perianal and digital rectal examinations were                            normal.                           A few medium-mouthed diverticula were found in the                            right colon. There was no evidence of diverticular                            bleeding.                           A few small-mouthed diverticula were found in the  left colon. There was no evidence of diverticular                            bleeding.                           Internal hemorrhoids were found during                            retroflexion. The hemorrhoids were small and Grade                            I (internal hemorrhoids that do not prolapse).                           The exam was otherwise without abnormality on                            direct and retroflexion views. Complications:            No immediate complications. Estimated blood loss:                            None. Estimated Blood Loss:     Estimated blood loss: none. Impression:               - Mild diverticulosis in the right colon.                           - Mild diverticulosis in the left colon.                           - Internal hemorrhoids.                           - The examination was otherwise normal on direct                            and retroflexion views.                            - No specimens collected. Recommendation:           - Patient has a contact number available for                            emergencies. The signs and symptoms of potential                            delayed complications were discussed with the                            patient. Return to normal activities tomorrow.                            Written discharge instructions were provided to the  patient.                           - Resume previous diet.                           - Continue present medications.                           - No repeat colonoscopy due to age and the absence                            of colonic polyps. Ladene Artist, MD 04/11/2020 8:46:50 AM This report has been signed electronically.

## 2020-04-11 NOTE — Patient Instructions (Addendum)
Read all of the handouts given to you by your recovery room nurse.  Tank-you for choosing [place] for your healthcare needs today.  YOU HAD AN ENDOSCOPIC PROCEDURE TODAY AT [facility]:   Refer to the procedure report that was given to you for any specific questions about what was found during the examination.  If the procedure report does not answer your questions, please call your gastroenterologist to clarify.  If you requested that your care partner not be given the details of your procedure findings, then the procedure report has been included in a sealed envelope for you to review at your convenience later.  YOU SHOULD EXPECT: Some feelings of bloating in the abdomen. Passage of more gas than usual.  Walking can help get rid of the air that was put into your GI tract during the procedure and reduce the bloating. If you had a lower endoscopy (such as a colonoscopy or flexible sigmoidoscopy) you may notice spotting of blood in your stool or on the toilet paper. If you underwent a bowel prep for your procedure, you may not have a normal bowel movement for a few days.  Please Note:  You might notice some irritation and congestion in your nose or some drainage.  This is from the oxygen used during your procedure.  There is no need for concern and it should clear up in a day or so.  SYMPTOMS TO REPORT IMMEDIATELY:   Following lower endoscopy (colonoscopy or flexible sigmoidoscopy):  Excessive amounts of blood in the stool  Significant tenderness or worsening of abdominal pains  Swelling of the abdomen that is new, acute  Fever of 100F or higher   For urgent or emergent issues, a gastroenterologist can be reached at any hour by calling [phone]. Do not use MyChart messaging for urgent concerns.    DIET:  We do recommend a small meal at first, but then you may proceed to your regular diet.  Drink plenty of fluids but you should avoid alcoholic beverages for 24 hours. Sometimes  a high fiber diet will help with constipation. ACTIVITY:  You should plan to take it easy for the rest of today and you should NOT DRIVE or use heavy machinery until tomorrow (because of the sedation medicines used during the test).    FOLLOW UP: Our staff will call the number listed on your records 48-72 hours following your procedure to check on you and address any questions or concerns that you may have regarding the information given to you following your procedure. If we do not reach you, we will leave a message.  We will attempt to reach you two times.  During this call, we will ask if you have developed any symptoms of COVID 19. If you develop any symptoms (ie: fever, flu-like symptoms, shortness of breath, cough etc.) before then, please call [phone].  If you test positive for Covid 19 in the 2 weeks post procedure, please call and report this information to [place].     SIGNATURES/CONFIDENTIALITY: You and/or your care partner have signed paperwork which will be entered into your electronic medical record.  These signatures attest to the fact that that the information above on your After Visit Summary has been reviewed and is understood.  Full responsibility of the confidentiality of this discharge information lies with you and/or your care-partner.

## 2020-04-11 NOTE — Progress Notes (Signed)
Pt's states no medical or surgical changes since previsit or office visit.   V/s-cw  Check-in-jb 

## 2020-04-11 NOTE — Progress Notes (Signed)
Report given to PACU, vss 

## 2020-04-13 ENCOUNTER — Telehealth: Payer: Self-pay | Admitting: *Deleted

## 2020-04-13 NOTE — Telephone Encounter (Signed)
  Follow up Call-  Call back number 04/11/2020  Post procedure Call Back phone  # [phone]  Permission to leave phone message Yes  Some recent data might be hidden     Patient questions:  Do you have a fever, pain , or abdominal swelling? No. Pain Score  0 *  Have you tolerated food without any problems? Yes.    Have you been able to return to your normal activities? Yes.    Do you have any questions about your discharge instructions: Diet   No. Medications  No. Follow up visit  No.  Do you have questions or concerns about your Care? No.  Actions: * If pain score is 4 or above: No action needed, pain <4.  1. Have you developed a fever since your procedure? no  2.   Have you had an respiratory symptoms (SOB or cough) since your procedure? no  3.   Have you tested positive for COVID 19 since your procedure no  4.   Have you had any family members/close contacts diagnosed with the COVID 19 since your procedure?  no   If yes to any of these questions please route to Joylene John, RN and Erenest Rasher, RN

## 2020-04-29 DIAGNOSIS — E1165 Type 2 diabetes mellitus with hyperglycemia: Secondary | ICD-10-CM | POA: Diagnosis not present

## 2020-04-29 DIAGNOSIS — M1991 Primary osteoarthritis, unspecified site: Secondary | ICD-10-CM | POA: Diagnosis not present

## 2020-04-29 DIAGNOSIS — N400 Enlarged prostate without lower urinary tract symptoms: Secondary | ICD-10-CM | POA: Diagnosis not present

## 2020-04-29 DIAGNOSIS — I10 Essential (primary) hypertension: Secondary | ICD-10-CM | POA: Diagnosis not present

## 2020-05-18 DIAGNOSIS — Z6827 Body mass index (BMI) 27.0-27.9, adult: Secondary | ICD-10-CM | POA: Diagnosis not present

## 2020-05-18 DIAGNOSIS — E119 Type 2 diabetes mellitus without complications: Secondary | ICD-10-CM | POA: Diagnosis not present

## 2020-05-18 DIAGNOSIS — E663 Overweight: Secondary | ICD-10-CM | POA: Diagnosis not present

## 2020-05-18 DIAGNOSIS — E7849 Other hyperlipidemia: Secondary | ICD-10-CM | POA: Diagnosis not present

## 2020-05-18 DIAGNOSIS — I10 Essential (primary) hypertension: Secondary | ICD-10-CM | POA: Diagnosis not present

## 2020-05-18 DIAGNOSIS — Z23 Encounter for immunization: Secondary | ICD-10-CM | POA: Diagnosis not present

## 2020-05-24 DIAGNOSIS — I10 Essential (primary) hypertension: Secondary | ICD-10-CM | POA: Diagnosis not present

## 2020-05-24 DIAGNOSIS — Z6827 Body mass index (BMI) 27.0-27.9, adult: Secondary | ICD-10-CM | POA: Diagnosis not present

## 2020-05-24 DIAGNOSIS — E663 Overweight: Secondary | ICD-10-CM | POA: Diagnosis not present

## 2020-05-24 DIAGNOSIS — J329 Chronic sinusitis, unspecified: Secondary | ICD-10-CM | POA: Diagnosis not present

## 2020-05-29 ENCOUNTER — Other Ambulatory Visit (HOSPITAL_COMMUNITY): Payer: Self-pay | Admitting: Oncology

## 2020-05-29 DIAGNOSIS — U071 COVID-19: Secondary | ICD-10-CM

## 2020-05-29 NOTE — Progress Notes (Signed)
I connected by phone with  Nathaniel [name]  to discuss the potential use of an new treatment for mild to moderate COVID-19 viral infection in non-hospitalized patients.   This patient is a age/sex that meets the FDA criteria for Emergency Use Authorization of casirivimab\imdevimab.  Has a (+) direct SARS-CoV-2 viral test result 1. Has mild or moderate COVID-19  2. Is ? 74 years of age and weighs ? 40 kg 3. Is NOT hospitalized due to COVID-19 4. Is NOT requiring oxygen therapy or requiring an increase in baseline oxygen flow rate due to COVID-19 5. Is within 10 days of symptom onset 6. Has at least one of the high risk factor(s) for progression to severe COVID-19 and/or hospitalization as defined in EUA. ? Specific high risk criteria :age    Symptom onset 05/23/20   I have spoken and communicated the following to the patient or parent/caregiver:   1. FDA has authorized the emergency use of casirivimab\imdevimab for the treatment of mild to moderate COVID-19 in adults and pediatric patients with positive results of direct SARS-CoV-2 viral testing who are 60 years of age and older weighing at least 40 kg, and who are at high risk for progressing to severe COVID-19 and/or hospitalization.   2. The significant known and potential risks and benefits of casirivimab\imdevimab, and the extent to which such potential risks and benefits are unknown.   3. Information on available alternative treatments and the risks and benefits of those alternatives, including clinical trials.   4. Patients treated with casirivimab\imdevimab should continue to self-isolate and use infection control measures (e.g., wear mask, isolate, social distance, avoid sharing personal items, clean and disinfect "high touch" surfaces, and frequent handwashing) according to CDC guidelines.    5. The patient or parent/caregiver has the option to accept or refuse casirivimab\imdevimab .   After reviewing this information with the  patient, The patient agreed to proceed with receiving casirivimab\imdevimab infusion and will be provided a copy of the Fact sheet prior to receiving the infusion.Rulon Abide, AGNP-C [phone] ([place])

## 2020-05-30 ENCOUNTER — Ambulatory Visit (HOSPITAL_COMMUNITY)
Admission: RE | Admit: 2020-05-30 | Discharge: 2020-05-30 | Disposition: A | Discharge status: Home / Self Care | Payer: Medicare Other | Source: Ambulatory Visit | Attending: Pulmonary Disease | Admitting: Pulmonary Disease

## 2020-05-30 DIAGNOSIS — Z23 Encounter for immunization: Secondary | ICD-10-CM | POA: Diagnosis not present

## 2020-05-30 DIAGNOSIS — U071 COVID-19: Secondary | ICD-10-CM | POA: Insufficient documentation

## 2020-05-30 MED ORDER — DIPHENHYDRAMINE HCL 50 MG/ML IJ SOLN: 50.0000 mg | Freq: Once | INTRAMUSCULAR | Status: DC | PRN

## 2020-05-30 MED ORDER — FAMOTIDINE IN NACL 20-0.9 MG/50ML-% IV SOLN: 20.0000 mg | Freq: Once | INTRAVENOUS | Status: DC | PRN

## 2020-05-30 MED ORDER — METHYLPREDNISOLONE SODIUM SUCC 125 MG IJ SOLR: 125.0000 mg | Freq: Once | INTRAMUSCULAR | Status: DC | PRN

## 2020-05-30 MED ORDER — EPINEPHRINE 0.3 MG/0.3ML IJ SOAJ: 0.3000 mg | Freq: Once | INTRAMUSCULAR | Status: DC | PRN

## 2020-05-30 MED ORDER — ALBUTEROL SULFATE HFA 108 (90 BASE) MCG/ACT IN AERS: 2.0000 | INHALATION_SPRAY | Freq: Once | RESPIRATORY_TRACT | Status: DC | PRN

## 2020-05-30 MED ORDER — SODIUM CHLORIDE 0.9 % IV SOLN: INTRAVENOUS | Status: DC | PRN

## 2020-05-30 MED ORDER — SODIUM CHLORIDE 0.9 % IV SOLN
1200.0000 mg | Freq: Once | INTRAVENOUS | Status: AC
  Administered 2020-05-30: 1200 mg via INTRAVENOUS

## 2020-05-30 NOTE — Progress Notes (Signed)
  Diagnosis: COVID-19  Physician: Dr Patrick Wright  Procedure: [facility] Med: casirivimab\imdevimab infusion - Provided patient with casirivimab\imdevimab fact sheet for patients, parents and caregivers prior to infusion.  Complications: No immediate complications noted.  Discharge: Discharged home   Nathaniel [name] 05/30/2020  

## 2020-05-30 NOTE — Discharge Instructions (Signed)

## 2020-06-30 DIAGNOSIS — E1165 Type 2 diabetes mellitus with hyperglycemia: Secondary | ICD-10-CM | POA: Diagnosis not present

## 2020-06-30 DIAGNOSIS — I10 Essential (primary) hypertension: Secondary | ICD-10-CM | POA: Diagnosis not present

## 2020-06-30 DIAGNOSIS — M1991 Primary osteoarthritis, unspecified site: Secondary | ICD-10-CM | POA: Diagnosis not present

## 2020-06-30 DIAGNOSIS — N400 Enlarged prostate without lower urinary tract symptoms: Secondary | ICD-10-CM | POA: Diagnosis not present

## 2020-08-30 DIAGNOSIS — E663 Overweight: Secondary | ICD-10-CM | POA: Diagnosis not present

## 2020-08-30 DIAGNOSIS — E7849 Other hyperlipidemia: Secondary | ICD-10-CM | POA: Diagnosis not present

## 2020-08-30 DIAGNOSIS — E1165 Type 2 diabetes mellitus with hyperglycemia: Secondary | ICD-10-CM | POA: Diagnosis not present

## 2020-08-30 DIAGNOSIS — M1991 Primary osteoarthritis, unspecified site: Secondary | ICD-10-CM | POA: Diagnosis not present

## 2020-08-30 DIAGNOSIS — I10 Essential (primary) hypertension: Secondary | ICD-10-CM | POA: Diagnosis not present

## 2020-08-30 DIAGNOSIS — N400 Enlarged prostate without lower urinary tract symptoms: Secondary | ICD-10-CM | POA: Diagnosis not present

## 2020-08-30 DIAGNOSIS — E118 Type 2 diabetes mellitus with unspecified complications: Secondary | ICD-10-CM | POA: Diagnosis not present

## 2020-08-30 DIAGNOSIS — Z6827 Body mass index (BMI) 27.0-27.9, adult: Secondary | ICD-10-CM | POA: Diagnosis not present

## 2020-10-04 DIAGNOSIS — J069 Acute upper respiratory infection, unspecified: Secondary | ICD-10-CM | POA: Diagnosis not present

## 2020-10-06 DIAGNOSIS — J069 Acute upper respiratory infection, unspecified: Secondary | ICD-10-CM | POA: Diagnosis not present

## 2020-10-19 DIAGNOSIS — H2513 Age-related nuclear cataract, bilateral: Secondary | ICD-10-CM | POA: Diagnosis not present

## 2020-10-19 DIAGNOSIS — H40051 Ocular hypertension, right eye: Secondary | ICD-10-CM | POA: Diagnosis not present

## 2020-12-28 DIAGNOSIS — E7849 Other hyperlipidemia: Secondary | ICD-10-CM | POA: Diagnosis not present

## 2020-12-28 DIAGNOSIS — I10 Essential (primary) hypertension: Secondary | ICD-10-CM | POA: Diagnosis not present

## 2020-12-28 DIAGNOSIS — E1165 Type 2 diabetes mellitus with hyperglycemia: Secondary | ICD-10-CM | POA: Diagnosis not present

## 2021-01-28 DIAGNOSIS — E7849 Other hyperlipidemia: Secondary | ICD-10-CM | POA: Diagnosis not present

## 2021-01-28 DIAGNOSIS — E1165 Type 2 diabetes mellitus with hyperglycemia: Secondary | ICD-10-CM | POA: Diagnosis not present

## 2021-01-28 DIAGNOSIS — I10 Essential (primary) hypertension: Secondary | ICD-10-CM | POA: Diagnosis not present

## 2021-02-28 DIAGNOSIS — E1165 Type 2 diabetes mellitus with hyperglycemia: Secondary | ICD-10-CM | POA: Diagnosis not present

## 2021-02-28 DIAGNOSIS — E7849 Other hyperlipidemia: Secondary | ICD-10-CM | POA: Diagnosis not present

## 2021-02-28 DIAGNOSIS — I10 Essential (primary) hypertension: Secondary | ICD-10-CM | POA: Diagnosis not present

## 2021-03-07 DIAGNOSIS — E7849 Other hyperlipidemia: Secondary | ICD-10-CM | POA: Diagnosis not present

## 2021-03-07 DIAGNOSIS — Z6827 Body mass index (BMI) 27.0-27.9, adult: Secondary | ICD-10-CM | POA: Diagnosis not present

## 2021-03-07 DIAGNOSIS — M1991 Primary osteoarthritis, unspecified site: Secondary | ICD-10-CM | POA: Diagnosis not present

## 2021-03-07 DIAGNOSIS — Z0001 Encounter for general adult medical examination with abnormal findings: Secondary | ICD-10-CM | POA: Diagnosis not present

## 2021-03-07 DIAGNOSIS — N401 Enlarged prostate with lower urinary tract symptoms: Secondary | ICD-10-CM | POA: Diagnosis not present

## 2021-03-07 DIAGNOSIS — Z1389 Encounter for screening for other disorder: Secondary | ICD-10-CM | POA: Diagnosis not present

## 2021-03-07 DIAGNOSIS — I10 Essential (primary) hypertension: Secondary | ICD-10-CM | POA: Diagnosis not present

## 2021-03-07 DIAGNOSIS — Z1331 Encounter for screening for depression: Secondary | ICD-10-CM | POA: Diagnosis not present

## 2021-03-07 DIAGNOSIS — E663 Overweight: Secondary | ICD-10-CM | POA: Diagnosis not present

## 2021-03-07 DIAGNOSIS — E118 Type 2 diabetes mellitus with unspecified complications: Secondary | ICD-10-CM | POA: Diagnosis not present

## 2021-03-30 DIAGNOSIS — E1165 Type 2 diabetes mellitus with hyperglycemia: Secondary | ICD-10-CM | POA: Diagnosis not present

## 2021-03-30 DIAGNOSIS — I10 Essential (primary) hypertension: Secondary | ICD-10-CM | POA: Diagnosis not present

## 2021-03-30 DIAGNOSIS — E782 Mixed hyperlipidemia: Secondary | ICD-10-CM | POA: Diagnosis not present

## 2021-04-30 DIAGNOSIS — E782 Mixed hyperlipidemia: Secondary | ICD-10-CM | POA: Diagnosis not present

## 2021-04-30 DIAGNOSIS — I10 Essential (primary) hypertension: Secondary | ICD-10-CM | POA: Diagnosis not present

## 2021-04-30 DIAGNOSIS — E1165 Type 2 diabetes mellitus with hyperglycemia: Secondary | ICD-10-CM | POA: Diagnosis not present

## 2021-06-29 DIAGNOSIS — Z23 Encounter for immunization: Secondary | ICD-10-CM | POA: Diagnosis not present

## 2021-06-30 DIAGNOSIS — E1165 Type 2 diabetes mellitus with hyperglycemia: Secondary | ICD-10-CM | POA: Diagnosis not present

## 2021-06-30 DIAGNOSIS — I10 Essential (primary) hypertension: Secondary | ICD-10-CM | POA: Diagnosis not present

## 2021-06-30 DIAGNOSIS — E782 Mixed hyperlipidemia: Secondary | ICD-10-CM | POA: Diagnosis not present

## 2021-09-06 DIAGNOSIS — E7849 Other hyperlipidemia: Secondary | ICD-10-CM | POA: Diagnosis not present

## 2021-09-06 DIAGNOSIS — Z6827 Body mass index (BMI) 27.0-27.9, adult: Secondary | ICD-10-CM | POA: Diagnosis not present

## 2021-09-06 DIAGNOSIS — N401 Enlarged prostate with lower urinary tract symptoms: Secondary | ICD-10-CM | POA: Diagnosis not present

## 2021-09-06 DIAGNOSIS — E782 Mixed hyperlipidemia: Secondary | ICD-10-CM | POA: Diagnosis not present

## 2021-09-06 DIAGNOSIS — E118 Type 2 diabetes mellitus with unspecified complications: Secondary | ICD-10-CM | POA: Diagnosis not present

## 2021-09-06 DIAGNOSIS — I10 Essential (primary) hypertension: Secondary | ICD-10-CM | POA: Diagnosis not present

## 2021-09-06 DIAGNOSIS — M1991 Primary osteoarthritis, unspecified site: Secondary | ICD-10-CM | POA: Diagnosis not present

## 2021-09-06 DIAGNOSIS — E663 Overweight: Secondary | ICD-10-CM | POA: Diagnosis not present

## 2021-10-25 DIAGNOSIS — H2513 Age-related nuclear cataract, bilateral: Secondary | ICD-10-CM | POA: Diagnosis not present

## 2021-10-25 DIAGNOSIS — H40051 Ocular hypertension, right eye: Secondary | ICD-10-CM | POA: Diagnosis not present

## 2022-01-25 DIAGNOSIS — E118 Type 2 diabetes mellitus with unspecified complications: Secondary | ICD-10-CM | POA: Diagnosis not present

## 2022-06-27 DIAGNOSIS — I10 Essential (primary) hypertension: Secondary | ICD-10-CM | POA: Diagnosis not present

## 2022-06-27 DIAGNOSIS — N400 Enlarged prostate without lower urinary tract symptoms: Secondary | ICD-10-CM | POA: Diagnosis not present

## 2022-06-27 DIAGNOSIS — M1991 Primary osteoarthritis, unspecified site: Secondary | ICD-10-CM | POA: Diagnosis not present

## 2022-06-27 DIAGNOSIS — E663 Overweight: Secondary | ICD-10-CM | POA: Diagnosis not present

## 2022-06-27 DIAGNOSIS — Z23 Encounter for immunization: Secondary | ICD-10-CM | POA: Diagnosis not present

## 2022-06-27 DIAGNOSIS — Z0001 Encounter for general adult medical examination with abnormal findings: Secondary | ICD-10-CM | POA: Diagnosis not present

## 2022-06-27 DIAGNOSIS — E119 Type 2 diabetes mellitus without complications: Secondary | ICD-10-CM | POA: Diagnosis not present

## 2022-06-27 DIAGNOSIS — Z6826 Body mass index (BMI) 26.0-26.9, adult: Secondary | ICD-10-CM | POA: Diagnosis not present

## 2022-06-27 DIAGNOSIS — E118 Type 2 diabetes mellitus with unspecified complications: Secondary | ICD-10-CM | POA: Diagnosis not present

## 2022-06-27 DIAGNOSIS — Z1331 Encounter for screening for depression: Secondary | ICD-10-CM | POA: Diagnosis not present

## 2022-06-27 DIAGNOSIS — Z125 Encounter for screening for malignant neoplasm of prostate: Secondary | ICD-10-CM | POA: Diagnosis not present

## 2022-06-27 DIAGNOSIS — E7849 Other hyperlipidemia: Secondary | ICD-10-CM | POA: Diagnosis not present

## 2022-06-27 DIAGNOSIS — E782 Mixed hyperlipidemia: Secondary | ICD-10-CM | POA: Diagnosis not present

## 2022-09-05 DIAGNOSIS — U071 COVID-19: Secondary | ICD-10-CM | POA: Diagnosis not present

## 2022-09-05 DIAGNOSIS — E663 Overweight: Secondary | ICD-10-CM | POA: Diagnosis not present

## 2022-09-05 DIAGNOSIS — Z6826 Body mass index (BMI) 26.0-26.9, adult: Secondary | ICD-10-CM | POA: Diagnosis not present

## 2022-10-31 DIAGNOSIS — H35372 Puckering of macula, left eye: Secondary | ICD-10-CM | POA: Diagnosis not present

## 2023-01-30 DIAGNOSIS — Z6826 Body mass index (BMI) 26.0-26.9, adult: Secondary | ICD-10-CM | POA: Diagnosis not present

## 2023-01-30 DIAGNOSIS — E119 Type 2 diabetes mellitus without complications: Secondary | ICD-10-CM | POA: Diagnosis not present

## 2023-01-30 DIAGNOSIS — E663 Overweight: Secondary | ICD-10-CM | POA: Diagnosis not present

## 2023-01-30 DIAGNOSIS — J029 Acute pharyngitis, unspecified: Secondary | ICD-10-CM | POA: Diagnosis not present

## 2023-01-30 DIAGNOSIS — J302 Other seasonal allergic rhinitis: Secondary | ICD-10-CM | POA: Diagnosis not present

## 2023-07-11 ENCOUNTER — Ambulatory Visit (HOSPITAL_COMMUNITY)
Admission: RE | Admit: 2023-07-11 | Discharge: 2023-07-11 | Disposition: A | Discharge status: Home / Self Care | Payer: PPO | Source: Ambulatory Visit | Attending: Family Medicine | Admitting: Family Medicine

## 2023-07-11 ENCOUNTER — Other Ambulatory Visit (HOSPITAL_COMMUNITY): Payer: Self-pay | Admitting: Family Medicine

## 2023-07-11 DIAGNOSIS — N401 Enlarged prostate with lower urinary tract symptoms: Secondary | ICD-10-CM | POA: Diagnosis not present

## 2023-07-11 DIAGNOSIS — R7303 Prediabetes: Secondary | ICD-10-CM | POA: Diagnosis not present

## 2023-07-11 DIAGNOSIS — Z6826 Body mass index (BMI) 26.0-26.9, adult: Secondary | ICD-10-CM | POA: Diagnosis not present

## 2023-07-11 DIAGNOSIS — J029 Acute pharyngitis, unspecified: Secondary | ICD-10-CM | POA: Diagnosis not present

## 2023-07-11 DIAGNOSIS — Z1331 Encounter for screening for depression: Secondary | ICD-10-CM | POA: Diagnosis not present

## 2023-07-11 DIAGNOSIS — E782 Mixed hyperlipidemia: Secondary | ICD-10-CM | POA: Diagnosis not present

## 2023-07-11 DIAGNOSIS — Z23 Encounter for immunization: Secondary | ICD-10-CM | POA: Diagnosis not present

## 2023-07-11 DIAGNOSIS — I10 Essential (primary) hypertension: Secondary | ICD-10-CM | POA: Diagnosis not present

## 2023-07-11 DIAGNOSIS — E119 Type 2 diabetes mellitus without complications: Secondary | ICD-10-CM | POA: Diagnosis not present

## 2023-07-11 DIAGNOSIS — Z0001 Encounter for general adult medical examination with abnormal findings: Secondary | ICD-10-CM | POA: Diagnosis not present

## 2023-07-11 DIAGNOSIS — U071 COVID-19: Secondary | ICD-10-CM | POA: Diagnosis not present

## 2023-07-11 DIAGNOSIS — R001 Bradycardia, unspecified: Secondary | ICD-10-CM | POA: Diagnosis not present

## 2023-11-04 DIAGNOSIS — H40053 Ocular hypertension, bilateral: Secondary | ICD-10-CM | POA: Diagnosis not present

## 2024-03-03 DIAGNOSIS — E7849 Other hyperlipidemia: Secondary | ICD-10-CM | POA: Diagnosis not present

## 2024-03-03 DIAGNOSIS — R6889 Other general symptoms and signs: Secondary | ICD-10-CM | POA: Diagnosis not present

## 2024-03-03 DIAGNOSIS — I10 Essential (primary) hypertension: Secondary | ICD-10-CM | POA: Diagnosis not present

## 2024-03-03 DIAGNOSIS — E1159 Type 2 diabetes mellitus with other circulatory complications: Secondary | ICD-10-CM | POA: Diagnosis not present

## 2024-03-03 DIAGNOSIS — E663 Overweight: Secondary | ICD-10-CM | POA: Diagnosis not present

## 2024-03-03 DIAGNOSIS — Z6827 Body mass index (BMI) 27.0-27.9, adult: Secondary | ICD-10-CM | POA: Diagnosis not present

## 2024-03-03 DIAGNOSIS — J302 Other seasonal allergic rhinitis: Secondary | ICD-10-CM | POA: Diagnosis not present

## 2024-03-03 DIAGNOSIS — Z20828 Contact with and (suspected) exposure to other viral communicable diseases: Secondary | ICD-10-CM | POA: Diagnosis not present

## 2024-03-03 DIAGNOSIS — E782 Mixed hyperlipidemia: Secondary | ICD-10-CM | POA: Diagnosis not present

## 2024-05-07 DIAGNOSIS — H40053 Ocular hypertension, bilateral: Secondary | ICD-10-CM | POA: Diagnosis not present

## 2024-07-29 DIAGNOSIS — E785 Hyperlipidemia, unspecified: Secondary | ICD-10-CM | POA: Diagnosis not present

## 2024-07-29 DIAGNOSIS — Z0000 Encounter for general adult medical examination without abnormal findings: Secondary | ICD-10-CM | POA: Diagnosis not present

## 2024-07-29 DIAGNOSIS — N401 Enlarged prostate with lower urinary tract symptoms: Secondary | ICD-10-CM | POA: Diagnosis not present

## 2024-07-29 DIAGNOSIS — E119 Type 2 diabetes mellitus without complications: Secondary | ICD-10-CM | POA: Diagnosis not present

## 2024-07-29 DIAGNOSIS — Z125 Encounter for screening for malignant neoplasm of prostate: Secondary | ICD-10-CM | POA: Diagnosis not present

## 2024-07-29 DIAGNOSIS — I10 Essential (primary) hypertension: Secondary | ICD-10-CM | POA: Diagnosis not present

## 2024-07-29 DIAGNOSIS — Z23 Encounter for immunization: Secondary | ICD-10-CM | POA: Diagnosis not present

## 2024-09-02 ENCOUNTER — Other Ambulatory Visit: Payer: Self-pay

## 2024-09-02 ENCOUNTER — Encounter (HOSPITAL_COMMUNITY): Payer: Self-pay

## 2024-09-02 ENCOUNTER — Emergency Department (HOSPITAL_COMMUNITY)

## 2024-09-02 ENCOUNTER — Emergency Department (HOSPITAL_COMMUNITY)
Admission: EM | Admit: 2024-09-02 | Discharge: 2024-09-02 | Disposition: A | Discharge status: Home / Self Care | Attending: Emergency Medicine | Admitting: Emergency Medicine

## 2024-09-02 DIAGNOSIS — R109 Unspecified abdominal pain: Secondary | ICD-10-CM | POA: Diagnosis not present

## 2024-09-02 DIAGNOSIS — I10 Essential (primary) hypertension: Secondary | ICD-10-CM | POA: Diagnosis not present

## 2024-09-02 DIAGNOSIS — K760 Fatty (change of) liver, not elsewhere classified: Secondary | ICD-10-CM | POA: Diagnosis not present

## 2024-09-02 DIAGNOSIS — Z7982 Long term (current) use of aspirin: Secondary | ICD-10-CM | POA: Diagnosis not present

## 2024-09-02 DIAGNOSIS — Z7984 Long term (current) use of oral hypoglycemic drugs: Secondary | ICD-10-CM | POA: Diagnosis not present

## 2024-09-02 DIAGNOSIS — Z79899 Other long term (current) drug therapy: Secondary | ICD-10-CM | POA: Insufficient documentation

## 2024-09-02 DIAGNOSIS — R112 Nausea with vomiting, unspecified: Secondary | ICD-10-CM | POA: Insufficient documentation

## 2024-09-02 DIAGNOSIS — N200 Calculus of kidney: Secondary | ICD-10-CM | POA: Diagnosis not present

## 2024-09-02 DIAGNOSIS — E119 Type 2 diabetes mellitus without complications: Secondary | ICD-10-CM | POA: Diagnosis not present

## 2024-09-02 LAB — COMPREHENSIVE METABOLIC PANEL WITH GFR
ALT: 13 U/L (ref 0–44)
AST: 16 U/L (ref 15–41)
Albumin: 4.1 g/dL (ref 3.5–5.0)
Alkaline Phosphatase: 81 U/L (ref 38–126)
Anion gap: 17 — ABNORMAL HIGH (ref 5–15)
BUN: 21 mg/dL (ref 8–23)
CO2: 22 mmol/L (ref 22–32)
Calcium: 8.6 mg/dL — ABNORMAL LOW (ref 8.9–10.3)
Chloride: 105 mmol/L (ref 98–111)
Creatinine, Ser: 1.26 mg/dL — ABNORMAL HIGH (ref 0.61–1.24)
GFR, Estimated: 59 mL/min — ABNORMAL LOW (ref >60)
Glucose, Bld: 190 mg/dL — ABNORMAL HIGH (ref 70–99)
Potassium: 3.3 mmol/L — ABNORMAL LOW (ref 3.5–5.1)
Sodium: 144 mmol/L (ref 135–145)
Total Bilirubin: 0.5 mg/dL (ref 0.0–1.2)
Total Protein: 6.9 g/dL (ref 6.5–8.1)

## 2024-09-02 LAB — URINALYSIS, ROUTINE W REFLEX MICROSCOPIC
Bilirubin Urine: NEGATIVE
Glucose, UA: NEGATIVE mg/dL
Hgb urine dipstick: NEGATIVE
Ketones, ur: 5 mg/dL — AB
Leukocytes,Ua: NEGATIVE
Nitrite: NEGATIVE
Protein, ur: NEGATIVE mg/dL
Specific Gravity, Urine: 1.01 (ref 1.005–1.030)
pH: 5 (ref 5.0–8.0)

## 2024-09-02 LAB — CBC
HCT: 43.7 % (ref 39.0–52.0)
Hemoglobin: 15.6 g/dL (ref 13.0–17.0)
MCH: 31.5 pg (ref 26.0–34.0)
MCHC: 35.7 g/dL (ref 30.0–36.0)
MCV: 88.3 fL (ref 80.0–100.0)
Platelets: 287 K/uL (ref 150–400)
RBC: 4.95 MIL/uL (ref 4.22–5.81)
RDW: 12.6 % (ref 11.5–15.5)
WBC: 11.6 K/uL — ABNORMAL HIGH (ref 4.0–10.5)
nRBC: 0 % (ref 0.0–0.2)

## 2024-09-02 LAB — LIPASE, BLOOD: Lipase: 29 U/L (ref 11–51)

## 2024-09-02 MED ORDER — POTASSIUM CHLORIDE CRYS ER 20 MEQ PO TBCR
20.0000 meq | EXTENDED_RELEASE_TABLET | Freq: Once | ORAL | Status: AC
  Administered 2024-09-02: 20 meq via ORAL
  Filled 2024-09-02: qty 1

## 2024-09-02 MED ORDER — LACTATED RINGERS IV BOLUS
1000.0000 mL | Freq: Once | INTRAVENOUS | Status: AC
  Administered 2024-09-02: 1000 mL via INTRAVENOUS

## 2024-09-02 MED ORDER — IOHEXOL 300 MG/ML  SOLN
100.0000 mL | Freq: Once | INTRAMUSCULAR | Status: AC | PRN
  Administered 2024-09-02: 100 mL via INTRAVENOUS

## 2024-09-02 MED ORDER — PROMETHAZINE HCL 12.5 MG PO TABS: 6.2500 mg | ORAL_TABLET | Freq: Three times a day (TID) | ORAL | 0 refills | Status: AC | PRN

## 2024-09-02 MED ORDER — ONDANSETRON HCL 4 MG/2ML IJ SOLN
4.0000 mg | Freq: Once | INTRAMUSCULAR | Status: AC | PRN
  Administered 2024-09-02: 4 mg via INTRAVENOUS
  Filled 2024-09-02: qty 2

## 2024-09-02 NOTE — ED Notes (Signed)
 Pt unable to void at this time.

## 2024-09-02 NOTE — Discharge Instructions (Addendum)
 Seen for nausea and vomiting, you are feeling better after fluids and nausea medicine.  CT scan did not show any acute abnormalities.  You can take Zofran  as needed that you have at home, if it is not working you can take the Phenergan, though this can make you very sleepy so take it carefully.  Drink plenty of fluids and rest.

## 2024-09-02 NOTE — ED Triage Notes (Signed)
 Pt having multiple episodes of vomiting since 4 am. Pt is has tried taking zofran  with no relief. Pt had shrimp last night unsure if he has food poisoning.

## 2024-09-02 NOTE — ED Provider Notes (Signed)
 [place] EMERGENCY DEPARTMENT AT [facility] Provider Note   CSN: [id number] Arrival date & time: 09/02/24  1843     Patient presents with: Emesis   Nathaniel [name] is a 78 y.o. male.  Is ER for nausea vomiting since about 430 this morning.  He tried Zofran  without relief.  His son is at bedside and states they both ate at [employer] last night.  Patient had shrimp, he has had this before without problems, but thinks he may have food poisoning.  He has not had any diarrhea, unsure if he has been passing gas today.  Denies fevers or chills, denies any [place] abdominal pain but states every time he takes a sip of water he may start to vomiting.  He has history of hypertension, high cholesterol diabetes which are controlled.    Emesis      Prior to Admission medications   Medication Sig Start Date End Date Taking? Authorizing Provider  amLODipine (NORVASC) 5 MG tablet Take 5 mg by mouth daily. 03/15/20   [provider]  aspirin EC 81 MG tablet Take 81 mg by mouth daily.    [provider]  atorvastatin (LIPITOR) 10 MG tablet Take 5 mg by mouth daily.     [provider]  clotrimazole-betamethasone (LOTRISONE) cream Apply 1 application topically 2 (two) times daily. Apply to groin area. 12/21/15   [provider]  diazepam  (VALIUM ) 5 MG tablet Take 1 tablet (5 mg total) by mouth at bedtime. Patient not taking: Reported on 03/24/2020 01/06/16   Sherrilee Belvie CROME, MD  diphenhydrAMINE  [facility]) 25 MG tablet Take 25 mg by mouth at bedtime as needed for sleep.    [provider]  finasteride (PROSCAR) 5 MG tablet Take 1 tablet by mouth daily. 12/21/15   [provider]  lisinopril (PRINIVIL,ZESTRIL) 10 MG tablet Take 10 mg by mouth daily. Patient not taking: Reported on 03/24/2020    [provider]  losartan (COZAAR) 100 MG tablet Take 100 mg by mouth daily. 03/15/20   [provider]  metFORMIN  (GLUCOPHAGE) 500 MG tablet SMARTSIG:1 Tablet(s) By Mouth Morning-Evening 01/25/20   [provider]  OVER THE COUNTER MEDICATION Take 1 tablet by mouth daily. [facility] glucosamine HCL chondroitin sulfate joint fluid hyaluronic acid uniflex fruitex-B-calcium fructoborate    [provider]  oxyCODONE -acetaminophen  (ROXICET) 5-325 MG tablet Take 1 tablet by mouth every 4 (four) hours as needed for severe pain. Patient not taking: Reported on 03/24/2020 01/06/16   Sherrilee Belvie CROME, MD  POTASSIUM GLUCONATE PO Take 550 mg by mouth daily.    [provider]  sulfamethoxazole -trimethoprim  (BACTRIM  DS,SEPTRA  DS) 800-160 MG tablet Take 1 tablet by mouth 2 (two) times daily. Patient not taking: Reported on 03/24/2020 01/06/16   Sherrilee Belvie CROME, MD  tamsulosin (FLOMAX) 0.4 MG CAPS capsule Take 0.4 mg by mouth daily after supper.     [provider]    Allergies: Patient has no known allergies.    Review of Systems  Gastrointestinal:  Positive for vomiting.    Updated Vital Signs BP (!) 185/91   Pulse 83   Temp 98.5 F (36.9 C) (Oral)   Resp 16   Ht 5' 11 (1.803 m)   Wt 90.3 kg   SpO2 93%   BMI 27.77 kg/m   Physical Exam Vitals and nursing note reviewed.  Constitutional:      General: He is not in acute distress.    Appearance:  He is well-developed.  HENT:     Head: Normocephalic and atraumatic.     Mouth/Throat:     Mouth: Mucous membranes are moist.  Eyes:     Conjunctiva/sclera: Conjunctivae normal.  Cardiovascular:     Rate and Rhythm: Normal rate and regular rhythm.     Heart sounds: No murmur heard. Pulmonary:     Effort: Pulmonary effort is normal. No respiratory distress.     Breath sounds: Normal breath sounds.  Abdominal:     Palpations: Abdomen is soft.     Tenderness: There is no abdominal tenderness.  Musculoskeletal:        General: No swelling.     Cervical back: Neck supple.  Skin:    General: Skin is warm and  dry.     Capillary Refill: Capillary refill takes less than 2 seconds.  Neurological:     General: No focal deficit present.     Mental Status: He is alert and oriented to person, place, and time.  Psychiatric:        Mood and Affect: Mood normal.     (all labs ordered are listed, but only abnormal results are displayed) Labs Reviewed  COMPREHENSIVE METABOLIC PANEL WITH GFR - Abnormal; Notable for the following components:      Result Value   Potassium 3.3 (*)    Glucose, Bld 190 (*)    Creatinine, Ser 1.26 (*)    Calcium 8.6 (*)    GFR, Estimated 59 (*)    Anion gap 17 (*)    All other components within normal limits  CBC - Abnormal; Notable for the following components:   WBC 11.6 (*)    All other components within normal limits  LIPASE, BLOOD  URINALYSIS, ROUTINE W REFLEX MICROSCOPIC    EKG: None  Radiology: No results found.   Procedures   Medications Ordered in the ED  lactated ringers  bolus 1,000 mL (has no administration in time range)  ondansetron  (ZOFRAN ) injection 4 mg (4 mg Intravenous Given 09/02/24 1854)                                    Medical Decision Making This patient presents to the ED for concern of nausea and vomiting, this involves an extensive number of treatment options, and is a complaint that carries with it a high risk of complications and morbidity.  The differential diagnosis includes gastroenteritis, gastritis, PUD, bowel obstruction, cholecystitis, UTI, other   Co morbidities that complicate the patient evaluation  Diabetes, hypertension   Additional history obtained:  Additional history obtained from EMR External records from outside source obtained and reviewed including previous labs and notes   Lab Tests:  I Ordered, and personally interpreted labs.  The pertinent results include: Potassium 3.3, glucose 190, creatinine 1.26, calcium 8.6, anion gap 17 CBC with white blood count 11.6, normal hemoglobin Urinalysis no  UTI Lipase normal   Imaging Studies ordered:  I ordered imaging studies including CT abdomen pelvis I independently visualized and interpreted imaging which showed no acute findings I agree with the radiologist interpretation     Problem List / ED Course / Critical interventions / Medication management  Nausea and vomiting-patient thinks is related to the shrimp he ate last night.  Labs show creatinine 1.26, last creatinine was 8 years ago and was 0.95, possibly very mild AKI.  He is given IV fluids and IV Zofran  has now  been able to tolerate p.o. fluids given his age, and lack of diarrhea with the nausea I did order CT to rule out obstruction or other acute intra-abdominal process and this did not show any acute findings.  He states he has Zofran  at home to use if needed and also gave prescription for Phenergan  in case the Zofran  is not adequate.  He was given strict return precautions.  He did have slightly increased anion gap, but CO2 is normal likely from dehydration.  I ordered medication including Zofran  and IV fluids for nausea Reevaluation of the patient after these medicines showed that the patient improved I have reviewed the patients home medicines and have made adjustments as needed    Test / Admission - Considered:  Patient does not meet criteria for admission at this time    Amount and/or Complexity of Data Reviewed Labs: ordered. Radiology: ordered.  Risk Prescription drug management.        Final diagnoses:  None    ED Discharge Orders     None          Nathaniel [name] 09/02/24 2331    Suzette Pac, MD 09/04/24 1234
